# Patient Record
Sex: Female | Born: 1980 | Race: Black or African American | Hispanic: No | Marital: Single | Smoking: Never smoker
Health system: Southern US, Community
[De-identification: ages and names within clinical notes are randomized; demographics above are authoritative.]

## PROBLEM LIST (undated history)

## (undated) DIAGNOSIS — G9332 Myalgic encephalomyelitis/chronic fatigue syndrome: Secondary | ICD-10-CM

## (undated) DIAGNOSIS — D649 Anemia, unspecified: Secondary | ICD-10-CM

## (undated) DIAGNOSIS — R5382 Chronic fatigue, unspecified: Secondary | ICD-10-CM

---

## 2010-08-25 ENCOUNTER — Other Ambulatory Visit (HOSPITAL_COMMUNITY): Payer: Self-pay | Admitting: Obstetrics and Gynecology

## 2010-08-25 DIAGNOSIS — Z3682 Encounter for antenatal screening for nuchal translucency: Secondary | ICD-10-CM

## 2010-09-03 ENCOUNTER — Other Ambulatory Visit (HOSPITAL_COMMUNITY): Payer: Self-pay | Admitting: Obstetrics and Gynecology

## 2010-09-03 ENCOUNTER — Ambulatory Visit (HOSPITAL_COMMUNITY)
Admission: RE | Admit: 2010-09-03 | Discharge: 2010-09-03 | Disposition: A | Discharge status: Home / Self Care | Payer: Medicaid Other | Source: Ambulatory Visit | Attending: Obstetrics and Gynecology | Admitting: Obstetrics and Gynecology

## 2010-09-03 ENCOUNTER — Encounter (HOSPITAL_COMMUNITY): Payer: Self-pay

## 2010-09-03 ENCOUNTER — Other Ambulatory Visit: Payer: Self-pay | Admitting: Maternal and Fetal Medicine

## 2010-09-03 VITALS — BP 105/65 | HR 89 | Wt 236.0 lb

## 2010-09-03 DIAGNOSIS — O99210 Obesity complicating pregnancy, unspecified trimester: Secondary | ICD-10-CM | POA: Insufficient documentation

## 2010-09-03 DIAGNOSIS — Z3682 Encounter for antenatal screening for nuchal translucency: Secondary | ICD-10-CM

## 2010-09-03 DIAGNOSIS — O3510X0 Maternal care for (suspected) chromosomal abnormality in fetus, unspecified, not applicable or unspecified: Secondary | ICD-10-CM | POA: Insufficient documentation

## 2010-09-03 DIAGNOSIS — O34219 Maternal care for unspecified type scar from previous cesarean delivery: Secondary | ICD-10-CM | POA: Insufficient documentation

## 2010-09-03 DIAGNOSIS — O09219 Supervision of pregnancy with history of pre-term labor, unspecified trimester: Secondary | ICD-10-CM | POA: Insufficient documentation

## 2010-09-03 DIAGNOSIS — E669 Obesity, unspecified: Secondary | ICD-10-CM | POA: Insufficient documentation

## 2010-09-03 DIAGNOSIS — Z3689 Encounter for other specified antenatal screening: Secondary | ICD-10-CM | POA: Insufficient documentation

## 2010-09-03 DIAGNOSIS — O351XX0 Maternal care for (suspected) chromosomal abnormality in fetus, not applicable or unspecified: Secondary | ICD-10-CM | POA: Insufficient documentation

## 2010-09-17 NOTE — Progress Notes (Signed)
See ultrasound report in [facility] 

## 2010-10-15 ENCOUNTER — Ambulatory Visit (HOSPITAL_COMMUNITY)
Admission: RE | Admit: 2010-10-15 | Discharge: 2010-10-15 | Disposition: A | Discharge status: Home / Self Care | Payer: Medicaid Other | Source: Ambulatory Visit | Attending: Obstetrics and Gynecology | Admitting: Obstetrics and Gynecology

## 2010-10-15 DIAGNOSIS — O358XX0 Maternal care for other (suspected) fetal abnormality and damage, not applicable or unspecified: Secondary | ICD-10-CM | POA: Insufficient documentation

## 2010-10-15 DIAGNOSIS — Z1389 Encounter for screening for other disorder: Secondary | ICD-10-CM | POA: Insufficient documentation

## 2010-10-15 DIAGNOSIS — Z3682 Encounter for antenatal screening for nuchal translucency: Secondary | ICD-10-CM

## 2010-10-15 DIAGNOSIS — E669 Obesity, unspecified: Secondary | ICD-10-CM | POA: Insufficient documentation

## 2010-10-15 DIAGNOSIS — O99210 Obesity complicating pregnancy, unspecified trimester: Secondary | ICD-10-CM | POA: Insufficient documentation

## 2010-10-15 DIAGNOSIS — Z363 Encounter for antenatal screening for malformations: Secondary | ICD-10-CM | POA: Insufficient documentation

## 2010-10-15 DIAGNOSIS — O09219 Supervision of pregnancy with history of pre-term labor, unspecified trimester: Secondary | ICD-10-CM | POA: Insufficient documentation

## 2010-10-15 DIAGNOSIS — O34219 Maternal care for unspecified type scar from previous cesarean delivery: Secondary | ICD-10-CM | POA: Insufficient documentation

## 2010-10-15 NOTE — Progress Notes (Signed)
Ultrasound in [facility].  Follow up U/S scheduled 

## 2010-11-04 ENCOUNTER — Ambulatory Visit (HOSPITAL_COMMUNITY)
Admission: RE | Admit: 2010-11-04 | Discharge: 2010-11-04 | Disposition: A | Discharge status: Home / Self Care | Payer: Medicaid Other | Source: Ambulatory Visit | Attending: Obstetrics and Gynecology | Admitting: Obstetrics and Gynecology

## 2010-11-04 DIAGNOSIS — O34219 Maternal care for unspecified type scar from previous cesarean delivery: Secondary | ICD-10-CM | POA: Insufficient documentation

## 2010-11-04 DIAGNOSIS — Z3682 Encounter for antenatal screening for nuchal translucency: Secondary | ICD-10-CM

## 2010-11-04 DIAGNOSIS — O09219 Supervision of pregnancy with history of pre-term labor, unspecified trimester: Secondary | ICD-10-CM | POA: Insufficient documentation

## 2010-11-04 DIAGNOSIS — E669 Obesity, unspecified: Secondary | ICD-10-CM | POA: Insufficient documentation

## 2010-11-04 DIAGNOSIS — O99330 Smoking (tobacco) complicating pregnancy, unspecified trimester: Secondary | ICD-10-CM | POA: Insufficient documentation

## 2010-11-04 NOTE — Progress Notes (Signed)
Patient seen for ultrasound appointment today.  Please see [facility] report for details.  

## 2013-12-18 ENCOUNTER — Encounter (HOSPITAL_COMMUNITY): Payer: Self-pay

## 2015-02-13 ENCOUNTER — Emergency Department (HOSPITAL_BASED_OUTPATIENT_CLINIC_OR_DEPARTMENT_OTHER)
Admission: EM | Admit: 2015-02-13 | Discharge: 2015-02-13 | Disposition: A | Discharge status: Home / Self Care | Payer: No Typology Code available for payment source | Attending: Emergency Medicine | Admitting: Emergency Medicine

## 2015-02-13 ENCOUNTER — Emergency Department (HOSPITAL_BASED_OUTPATIENT_CLINIC_OR_DEPARTMENT_OTHER): Payer: No Typology Code available for payment source

## 2015-02-13 ENCOUNTER — Encounter (HOSPITAL_BASED_OUTPATIENT_CLINIC_OR_DEPARTMENT_OTHER): Payer: Self-pay | Admitting: *Deleted

## 2015-02-13 DIAGNOSIS — S8001XA Contusion of right knee, initial encounter: Secondary | ICD-10-CM | POA: Insufficient documentation

## 2015-02-13 DIAGNOSIS — S8991XA Unspecified injury of right lower leg, initial encounter: Secondary | ICD-10-CM | POA: Diagnosis present

## 2015-02-13 DIAGNOSIS — Y9389 Activity, other specified: Secondary | ICD-10-CM | POA: Diagnosis not present

## 2015-02-13 DIAGNOSIS — Y92410 Unspecified street and highway as the place of occurrence of the external cause: Secondary | ICD-10-CM | POA: Insufficient documentation

## 2015-02-13 DIAGNOSIS — Y998 Other external cause status: Secondary | ICD-10-CM | POA: Insufficient documentation

## 2015-02-13 MED ORDER — IBUPROFEN 200 MG PO TABS
600.0000 mg | ORAL_TABLET | Freq: Once | ORAL | Status: AC
  Administered 2015-02-13: 600 mg via ORAL
  Filled 2015-02-13: qty 1

## 2015-02-13 MED ORDER — IBUPROFEN 800 MG PO TABS: 800.0000 mg | ORAL_TABLET | Freq: Three times a day (TID) | ORAL | Status: DC | PRN

## 2015-02-13 MED ORDER — CYCLOBENZAPRINE HCL 10 MG PO TABS: 10.0000 mg | ORAL_TABLET | Freq: Three times a day (TID) | ORAL | Status: DC | PRN
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
## 2015-08-15 ENCOUNTER — Encounter (HOSPITAL_BASED_OUTPATIENT_CLINIC_OR_DEPARTMENT_OTHER): Payer: Self-pay | Admitting: *Deleted

## 2015-08-15 ENCOUNTER — Emergency Department (HOSPITAL_BASED_OUTPATIENT_CLINIC_OR_DEPARTMENT_OTHER)
Admission: EM | Admit: 2015-08-15 | Discharge: 2015-08-15 | Disposition: A | Discharge status: Home / Self Care | Payer: Self-pay | Attending: Emergency Medicine | Admitting: Emergency Medicine

## 2015-08-15 ENCOUNTER — Emergency Department (HOSPITAL_BASED_OUTPATIENT_CLINIC_OR_DEPARTMENT_OTHER): Payer: Self-pay

## 2015-08-15 DIAGNOSIS — M549 Dorsalgia, unspecified: Secondary | ICD-10-CM | POA: Insufficient documentation

## 2015-08-15 DIAGNOSIS — Y9389 Activity, other specified: Secondary | ICD-10-CM | POA: Insufficient documentation

## 2015-08-15 DIAGNOSIS — Y999 Unspecified external cause status: Secondary | ICD-10-CM | POA: Insufficient documentation

## 2015-08-15 DIAGNOSIS — W1809XA Striking against other object with subsequent fall, initial encounter: Secondary | ICD-10-CM

## 2015-08-15 DIAGNOSIS — Y929 Unspecified place or not applicable: Secondary | ICD-10-CM | POA: Insufficient documentation

## 2015-08-15 DIAGNOSIS — W01198A Fall on same level from slipping, tripping and stumbling with subsequent striking against other object, initial encounter: Secondary | ICD-10-CM | POA: Insufficient documentation

## 2015-08-15 MED ORDER — HYDROCODONE-ACETAMINOPHEN 5-325 MG PO TABS: 1.0000 | ORAL_TABLET | Freq: Four times a day (QID) | ORAL | Status: DC | PRN

## 2015-08-15 MED ORDER — HYDROCODONE-ACETAMINOPHEN 5-325 MG PO TABS
2.0000 | ORAL_TABLET | Freq: Once | ORAL | Status: AC
  Administered 2015-08-15: 2 via ORAL
  Filled 2015-08-15: qty 2

## 2015-08-15 MED FILL — HYDROCODON-APAP 5-325: 5-325 | 2 days supply | Qty: 10 | Fill #0

## 2015-08-15 NOTE — ED Notes (Signed)
She slipped last night getting out of the tub hitting her mid back. Painful today.

## 2015-08-15 NOTE — ED Notes (Signed)
Patient transported to X-ray 

## 2015-08-15 NOTE — Discharge Instructions (Signed)
Take Tylenol or Advil for mild pain or the pain medicine prescribed for bad pain. Don't take Tylenol together with the pain medicine prescribed as the combination can be dangerous. Contact your primary care physician if having significant pain in a week.
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
## 2015-08-15 NOTE — ED Provider Notes (Signed)
CSN: [id number]     Arrival date & time 08/15/15  1030 History   First MD Initiated Contact with Patient 08/15/15 1058     Chief Complaint  Patient presents with  . Back Injury     (Consider location/radiation/quality/duration/timing/severity/associated sxs/prior Treatment) HPI Patient fell last night getting out of the bathtub. She slipped and fell striking her back against the tile but. She complains of mid back pain radiating to her lower back and to bilateral thighs since the event. Treated with Tylenol, without relief. Pain worse with changing positions improved with remaining still. No other injury. No other associated symptoms History reviewed. No pertinent past medical history. History reviewed. No pertinent past surgical history. No family history on file. Social History  Substance Use Topics  . Smoking status: Never Smoker   . Smokeless tobacco: None  . Alcohol Use: No   OB History    Gravida Para Term Preterm AB TAB SAB Ectopic Multiple Living   3 2 1 1  0 0 0 0 0 2     Review of Systems  Constitutional: Negative.   HENT: Negative.   Respiratory: Negative.   Cardiovascular: Negative.   Gastrointestinal: Negative.   Genitourinary:       Currently on menses  Musculoskeletal: Positive for back pain.  Skin: Negative.   Neurological: Negative.   Psychiatric/Behavioral: Negative.   All other systems reviewed and are negative.     Allergies  Review of patient's allergies indicates no known allergies.  Home Medications   Prior to Admission medications   Medication Sig Start Date End Date Taking? Authorizing Provider  cyclobenzaprine (FLEXERIL) 10 MG tablet Take 1 tablet (10 mg total) by mouth 3 (three) times daily as needed for muscle spasms (or pain). 02/13/15   Trixie DredgeEmily West, PA-C  ibuprofen (ADVIL,MOTRIN) 800 MG tablet Take 1 tablet (800 mg total) by mouth every 8 (eight) hours as needed. 02/13/15   Trixie DredgeEmily West, PA-C  Prenatal Vitamins (DIS) TABS Take by mouth.       Historical Provider, MD   BP 114/81 mmHg  Pulse 87  Temp(Src) 97.7 F (36.5 C) (Oral)  Resp 16  Ht 5\' 8"  (1.727 m)  Wt 240 lb (108.863 kg)  BMI 36.50 kg/m2  SpO2 100%  LMP 08/09/2015 Physical Exam  Constitutional: She appears well-developed and well-nourished.  HENT:  Head: Normocephalic and atraumatic.  Eyes: Conjunctivae are normal. Pupils are equal, round, and reactive to light.  Neck: Neck supple. No tracheal deviation present. No thyromegaly present.  Cardiovascular: Normal rate and regular rhythm.   No murmur heard. Pulmonary/Chest: Effort normal and breath sounds normal.  Abdominal: Soft. Bowel sounds are normal. She exhibits no distension. There is no tenderness.  Obese  Musculoskeletal: Normal range of motion. She exhibits no edema or tenderness.  Entire spine is nontender. Bilateral parathoracic spinal tenderness.  Neurological: She is alert. She has normal reflexes. No cranial nerve deficit. Coordination normal.  DTRs symmetric bilaterally at knee jerk ankle jerk. Motor strength 5 over 5 overall  Skin: Skin is warm and dry. No rash noted.  Psychiatric: She has a normal mood and affect.  Nursing note and vitals reviewed.   ED Course  Procedures (including critical care time) Labs Review Labs Reviewed - No data to display  Imaging Review No results found. I have personally reviewed and evaluated these images and lab results as part of my medical decision-making.   EKG Interpretation None     12:45 PM pain improved after treatment with Norco.  Patient is alert and ambulates without difficulty . X-rays viewed by me and discussed with patient No results found for this or any previous visit. Dg Thoracic Spine 2 View  08/15/2015  CLINICAL DATA:  Fall, hit back on the bathtub yesterday, mid back pain, low back pain EXAM: THORACIC SPINE 2 VIEWS COMPARISON:  Report 12/27/2006 no images available. FINDINGS: Three views of thoracic spine submitted. No acute fracture  or subluxation. Alignment, disc spaces and vertebral body heights are preserved. IMPRESSION: Negative. Electronically Signed   By: Natasha MeadLiviu  Pop M.D.   On: 08/15/2015 12:00   Dg Lumbar Spine Complete  08/15/2015  CLINICAL DATA:  Fall yesterday, low back pain, radiculopathy EXAM: LUMBAR SPINE - COMPLETE 4+ VIEW COMPARISON:  Report 06/03/2003 no images available. FINDINGS: Five views of the lumbar spine submitted. No acute fracture or subluxation. Alignment and vertebral body heights are preserved. Mild disc space flattening at L5-S1 level. IMPRESSION: No acute fracture or subluxation. Mild disc space flattening at L5-S1 level. Electronically Signed   By: Natasha MeadLiviu  Pop M.D.   On: 08/15/2015 12:02    MDM  Plan prescription Norco. Follow-up with PMD if significant pain one week Diagnosis #1 fall #2 back pain Final diagnoses:  None        Doug SouSam Shanetta Nicolls, MD 08/15/15 1248

## 2016-04-14 DIAGNOSIS — N920 Excessive and frequent menstruation with regular cycle: Secondary | ICD-10-CM | POA: Insufficient documentation

## 2016-04-14 DIAGNOSIS — F4321 Adjustment disorder with depressed mood: Secondary | ICD-10-CM | POA: Insufficient documentation

## 2016-09-21 DIAGNOSIS — D500 Iron deficiency anemia secondary to blood loss (chronic): Secondary | ICD-10-CM | POA: Insufficient documentation

## 2016-09-21 DIAGNOSIS — G9332 Myalgic encephalomyelitis/chronic fatigue syndrome: Secondary | ICD-10-CM | POA: Insufficient documentation

## 2016-09-21 DIAGNOSIS — R5382 Chronic fatigue, unspecified: Secondary | ICD-10-CM | POA: Insufficient documentation

## 2016-11-12 ENCOUNTER — Emergency Department (HOSPITAL_BASED_OUTPATIENT_CLINIC_OR_DEPARTMENT_OTHER)
Admission: EM | Admit: 2016-11-12 | Discharge: 2016-11-12 | Disposition: A | Discharge status: Home / Self Care | Payer: Medicaid Other | Attending: Emergency Medicine | Admitting: Emergency Medicine

## 2016-11-12 ENCOUNTER — Emergency Department (HOSPITAL_BASED_OUTPATIENT_CLINIC_OR_DEPARTMENT_OTHER): Payer: Medicaid Other

## 2016-11-12 ENCOUNTER — Encounter (HOSPITAL_BASED_OUTPATIENT_CLINIC_OR_DEPARTMENT_OTHER): Payer: Self-pay | Admitting: Emergency Medicine

## 2016-11-12 DIAGNOSIS — Z79899 Other long term (current) drug therapy: Secondary | ICD-10-CM | POA: Diagnosis not present

## 2016-11-12 DIAGNOSIS — M542 Cervicalgia: Secondary | ICD-10-CM | POA: Insufficient documentation

## 2016-11-12 HISTORY — DX: Anemia, unspecified: D64.9

## 2016-11-12 HISTORY — DX: Myalgic encephalomyelitis/chronic fatigue syndrome: G93.32

## 2016-11-12 HISTORY — DX: Chronic fatigue, unspecified: R53.82

## 2016-11-12 MED ORDER — HYDROMORPHONE HCL 1 MG/ML IJ SOLN
0.5000 mg | Freq: Once | INTRAMUSCULAR | Status: AC
  Administered 2016-11-12: 0.5 mg via INTRAVENOUS
  Filled 2016-11-12: qty 1

## 2016-11-12 MED ORDER — KETOROLAC TROMETHAMINE 30 MG/ML IJ SOLN
30.0000 mg | Freq: Once | INTRAMUSCULAR | Status: AC
  Administered 2016-11-12: 30 mg via INTRAVENOUS
  Filled 2016-11-12: qty 1

## 2016-11-12 MED ORDER — PREDNISONE 50 MG PO TABS
60.0000 mg | ORAL_TABLET | Freq: Once | ORAL | Status: AC
  Administered 2016-11-12: 60 mg via ORAL
  Filled 2016-11-12: qty 1

## 2016-11-12 MED ORDER — OXYCODONE-ACETAMINOPHEN 5-325 MG PO TABS
1.0000 | ORAL_TABLET | Freq: Once | ORAL | Status: AC
  Administered 2016-11-12: 1 via ORAL
  Filled 2016-11-12: qty 1

## 2016-11-12 MED ORDER — PREDNISONE 20 MG PO TABS: 40.0000 mg | ORAL_TABLET | Freq: Every day | ORAL | 0 refills | Status: DC

## 2016-11-12 MED ORDER — OXYCODONE-ACETAMINOPHEN 5-325 MG PO TABS: 1.0000 | ORAL_TABLET | Freq: Four times a day (QID) | ORAL | 0 refills | Status: DC | PRN

## 2016-11-12 MED ORDER — IOPAMIDOL (ISOVUE-370) INJECTION 76%
100.0000 mL | Freq: Once | INTRAVENOUS | Status: AC | PRN
  Administered 2016-11-12: 100 mL via INTRAVENOUS

## 2016-11-12 NOTE — ED Notes (Signed)
Patient transported to CT 

## 2016-11-12 NOTE — ED Triage Notes (Signed)
PResents with left sided neck pain that began 4 days ago. Aggravating factors include any movement to the left, eating and riding in the car. RElieving factors include nothing. She has taken tylenol and Ibuprofen and a muscle relaxer without relief. The pain is described as severe. She denis numbness, tingling, headache and tinnitus.

## 2016-11-13 NOTE — ED Provider Notes (Signed)
[facility]   CSN: [id number] Arrival date & time: 11/12/16  2018     History   Chief Complaint Chief Complaint  Patient presents with  . Neck Pain    HPI Jill Mclean is a 36 y.o. female.  HPI Patient presents with left-sided neck pain. No injury. Worse with movement. Worse with swallowing. No relief with Tylenol Motrin or muscle relaxer. Severe pain. Has not had pains like this before. No trauma. No vision changes. No numbness or weakness. Past Medical History:  Diagnosis Date  . Anemia   . Chronic fatigue syndrome     There are no active problems to display for this patient.   History reviewed. No pertinent surgical history.  OB History    Gravida Para Term Preterm AB Living   0 2   SAB TAB Ectopic Multiple Live Births   0 0 0 0         Home Medications    Prior to Admission medications   Medication Sig Start Date End Date Taking? Authorizing Provider  cyclobenzaprine (FLEXERIL) 10 MG tablet Take 1 tablet (10 mg total) by mouth 3 (three) times daily as needed for muscle spasms (or pain). 02/13/15   Trixie Dredge, PA-C  HYDROcodone-acetaminophen (NORCO) 5-325 MG tablet Take 1-2 tablets by mouth every 6 (six) hours as needed for moderate pain or severe pain. 08/15/15   Doug Sou, MD  ibuprofen (ADVIL,MOTRIN) 800 MG tablet Take 1 tablet (800 mg total) by mouth every 8 (eight) hours as needed. 02/13/15   Trixie Dredge, PA-C  oxyCODONE-acetaminophen (PERCOCET/ROXICET) 5-325 MG tablet Take 1-2 tablets by mouth every 6 (six) hours as needed for severe pain. 11/12/16   Benjiman Core, MD  predniSONE (DELTASONE) 20 MG tablet Take 2 tablets (40 mg total) by mouth daily. 11/13/16   Benjiman Core, MD  Prenatal Vitamins (DIS) TABS Take by mouth.      [provider]    Family History History reviewed. No pertinent family history.  Social History Social History  Substance Use Topics  . Smoking status: Never Smoker  .  Smokeless tobacco: Not on file  . Alcohol use No     Allergies   Patient has no known allergies.   Review of Systems Review of Systems  Constitutional: Negative for appetite change and fever.  HENT: Negative for congestion.   Respiratory: Negative for shortness of breath.   Cardiovascular: Negative for chest pain.  Gastrointestinal: Negative for abdominal pain.  Musculoskeletal: Positive for neck pain.  Skin: Negative for wound.  Neurological: Negative for numbness.  Hematological: Negative for adenopathy.  Psychiatric/Behavioral: Negative for confusion.     Physical Exam Updated Vital Signs BP 118/71 (BP Location: Left Arm)   Pulse 79   Temp 98.3 F (36.8 C) (Oral)   Resp 18   LMP 10/20/2016 (Approximate)   SpO2 100%   Breastfeeding? No   Physical Exam  Constitutional: She appears well-developed.  HENT:  Head: Atraumatic.  Neck: No thyromegaly present.  Tenderness to left lateral lower neck. No erythema. Decreased range of motion. No bruit. No midline cervical tenderness  Cardiovascular: Normal rate.   Pulmonary/Chest: Effort normal.  Musculoskeletal: She exhibits no edema.  Neurological: She is alert.  Skin: Skin is warm. Capillary refill takes less than 2 seconds.  Psychiatric: She has a normal mood and affect.     ED Treatments / Results  Labs (all labs ordered are listed, but only abnormal results are displayed) Labs  Reviewed - No data to display  EKG  EKG Interpretation None       Radiology Ct Angio Neck W And/or Wo Contrast  Result Date: 11/12/2016 CLINICAL DATA:  Left-sided neck pain for 4 days EXAM: CT ANGIOGRAPHY NECK TECHNIQUE: Multidetector CT imaging of the neck was performed using the standard protocol during bolus administration of intravenous contrast. Multiplanar CT image reconstructions and MIPs were obtained to evaluate the vascular anatomy. Carotid stenosis measurements (when applicable) are obtained utilizing NASCET criteria, using  the distal internal carotid diameter as the denominator. CONTRAST:  100 mL Isovue 370 COMPARISON:  None. FINDINGS: Aortic arch: Standard branching. Imaged portion shows no evidence of aneurysm or dissection. No significant stenosis of the major arch vessel origins. Right carotid system: No evidence of dissection, stenosis (50% or greater) or occlusion. Left carotid system: No evidence of dissection, stenosis (50% or greater) or occlusion. Vertebral arteries: Codominant. No evidence of dissection, stenosis (50% or greater) or occlusion. Skeleton: Negative Other neck: Normal thyroid gland. Pharynx and larynx are normal. Salivary glands are normal. No lymphadenopathy. Upper chest: Incidentally noted azygos lobe.  No focal abnormality. IMPRESSION: Normal CTA of the neck. Electronically Signed   By: Deatra Robinson M.D.   On: 11/12/2016 22:35    Procedures Procedures (including critical care time)  Medications Ordered in ED Medications  ketorolac (TORADOL) 30 MG/ML injection 30 mg (30 mg Intravenous Given 11/12/16 2116)  HYDROmorphone (DILAUDID) injection 0.5 mg (0.5 mg Intravenous Given 11/12/16 2116)  iopamidol (ISOVUE-370) 76 % injection 100 mL (100 mLs Intravenous Contrast Given 11/12/16 2159)  oxyCODONE-acetaminophen (PERCOCET/ROXICET) 5-325 MG per tablet 1 tablet (1 tablet Oral Given 11/12/16 2321)  predniSONE (DELTASONE) tablet 60 mg (60 mg Oral Given 11/12/16 2321)     Initial Impression / Assessment and Plan / ED Course  I have reviewed the triage vital signs and the nursing notes.  Pertinent labs & imaging results that were available during my care of the patient were reviewed by me and considered in my medical decision making (see chart for details).   patient with lateral neck pain. Decreased range of motion. CT angiogram done due to severe pain with movement. Reassuring. Discharge home.  Final Clinical Impressions(s) / ED Diagnoses   Final diagnoses:  Neck pain    New  Prescriptions Discharge Medication List as of 11/12/2016 11:02 PM    START taking these medications   Details  oxyCODONE-acetaminophen (PERCOCET/ROXICET) 5-325 MG tablet Take 1-2 tablets by mouth every 6 (six) hours as needed for severe pain., Starting Thu 11/12/2016, Print    predniSONE (DELTASONE) 20 MG tablet Take 2 tablets (40 mg total) by mouth daily., Starting Fri 11/13/2016, Print         Benjiman Core, MD 11/13/16 [phone]

## 2017-03-23 ENCOUNTER — Encounter (HOSPITAL_BASED_OUTPATIENT_CLINIC_OR_DEPARTMENT_OTHER): Payer: Self-pay

## 2017-03-23 ENCOUNTER — Other Ambulatory Visit: Payer: Self-pay

## 2017-03-23 DIAGNOSIS — Y9389 Activity, other specified: Secondary | ICD-10-CM | POA: Insufficient documentation

## 2017-03-23 DIAGNOSIS — Y92410 Unspecified street and highway as the place of occurrence of the external cause: Secondary | ICD-10-CM | POA: Insufficient documentation

## 2017-03-23 DIAGNOSIS — S161XXA Strain of muscle, fascia and tendon at neck level, initial encounter: Secondary | ICD-10-CM | POA: Insufficient documentation

## 2017-03-23 DIAGNOSIS — Y999 Unspecified external cause status: Secondary | ICD-10-CM | POA: Insufficient documentation
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
## 2017-03-24 ENCOUNTER — Emergency Department (HOSPITAL_BASED_OUTPATIENT_CLINIC_OR_DEPARTMENT_OTHER): Payer: Self-pay

## 2017-03-24 ENCOUNTER — Emergency Department (HOSPITAL_BASED_OUTPATIENT_CLINIC_OR_DEPARTMENT_OTHER)
Admission: EM | Admit: 2017-03-24 | Discharge: 2017-03-24 | Disposition: A | Discharge status: Home / Self Care | Payer: Self-pay | Attending: Emergency Medicine | Admitting: Emergency Medicine

## 2017-03-24 DIAGNOSIS — S161XXA Strain of muscle, fascia and tendon at neck level, initial encounter: Secondary | ICD-10-CM

## 2017-03-24 MED ORDER — CYCLOBENZAPRINE HCL 10 MG PO TABS: 10.0000 mg | ORAL_TABLET | Freq: Three times a day (TID) | ORAL | 0 refills | Status: DC | PRN

## 2017-03-24 MED ORDER — HYDROCODONE-ACETAMINOPHEN 5-325 MG PO TABS: 1.0000 | ORAL_TABLET | ORAL | 0 refills | Status: DC | PRN

## 2017-03-24 MED ORDER — NAPROXEN 500 MG PO TABS: 500.0000 mg | ORAL_TABLET | Freq: Two times a day (BID) | ORAL | 0 refills | Status: DC | PRN

## 2017-03-24 MED ORDER — KETOROLAC TROMETHAMINE 60 MG/2ML IM SOLN
60.0000 mg | Freq: Once | INTRAMUSCULAR | Status: AC
  Administered 2017-03-24: 60 mg via INTRAMUSCULAR
  Filled 2017-03-24: qty 2
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
## 2017-08-22 ENCOUNTER — Other Ambulatory Visit: Payer: Self-pay

## 2017-08-22 ENCOUNTER — Emergency Department (HOSPITAL_BASED_OUTPATIENT_CLINIC_OR_DEPARTMENT_OTHER)
Admission: EM | Admit: 2017-08-22 | Discharge: 2017-08-22 | Disposition: A | Discharge status: Home / Self Care | Payer: Self-pay | Attending: Emergency Medicine | Admitting: Emergency Medicine

## 2017-08-22 ENCOUNTER — Encounter (HOSPITAL_BASED_OUTPATIENT_CLINIC_OR_DEPARTMENT_OTHER): Payer: Self-pay | Admitting: Emergency Medicine

## 2017-08-22 DIAGNOSIS — R51 Headache: Secondary | ICD-10-CM | POA: Insufficient documentation

## 2017-08-22 DIAGNOSIS — R2241 Localized swelling, mass and lump, right lower limb: Secondary | ICD-10-CM | POA: Insufficient documentation

## 2017-08-22 DIAGNOSIS — K089 Disorder of teeth and supporting structures, unspecified: Secondary | ICD-10-CM

## 2017-08-22 DIAGNOSIS — M7989 Other specified soft tissue disorders: Secondary | ICD-10-CM

## 2017-08-22 DIAGNOSIS — R2242 Localized swelling, mass and lump, left lower limb: Secondary | ICD-10-CM | POA: Insufficient documentation

## 2017-08-22 DIAGNOSIS — K0889 Other specified disorders of teeth and supporting structures: Secondary | ICD-10-CM | POA: Insufficient documentation

## 2017-08-22 DIAGNOSIS — R82998 Other abnormal findings in urine: Secondary | ICD-10-CM | POA: Insufficient documentation

## 2017-08-22 DIAGNOSIS — G8929 Other chronic pain: Secondary | ICD-10-CM | POA: Insufficient documentation

## 2017-08-22 LAB — BASIC METABOLIC PANEL
Anion gap: 5 (ref 5–15)
BUN: 11 mg/dL (ref 6–20)
CHLORIDE: 105 mmol/L (ref 98–111)
CO2: 27 mmol/L (ref 22–32)
Calcium: 8.6 mg/dL — ABNORMAL LOW (ref 8.9–10.3)
Creatinine, Ser: 0.76 mg/dL (ref 0.44–1.00)
GFR calc Af Amer: >60 mL/min (ref >60)
GFR calc non Af Amer: >60 mL/min (ref >60)
Glucose, Bld: 106 mg/dL — ABNORMAL HIGH (ref 70–99)
POTASSIUM: 3.4 mmol/L — AB (ref 3.5–5.1)
Sodium: 137 mmol/L (ref 135–145)

## 2017-08-22 LAB — DIFFERENTIAL
Basophils Absolute: 0 10*3/uL (ref 0.0–0.1)
Basophils Relative: 0 %
Eosinophils Absolute: 0.1 10*3/uL (ref 0.0–0.7)
Eosinophils Relative: 2 %
Lymphocytes Relative: 31 %
Lymphs Abs: 1.3 10*3/uL (ref 0.7–4.0)
Monocytes Absolute: 0.5 10*3/uL (ref 0.1–1.0)
Monocytes Relative: 11 %
Neutro Abs: 2.2 10*3/uL (ref 1.7–7.7)
Neutrophils Relative %: 56 %

## 2017-08-22 LAB — URINALYSIS, ROUTINE W REFLEX MICROSCOPIC
BILIRUBIN URINE: NEGATIVE
GLUCOSE, UA: NEGATIVE mg/dL
Hgb urine dipstick: NEGATIVE
Ketones, ur: NEGATIVE mg/dL
Nitrite: NEGATIVE
PH: 5.5 (ref 5.0–8.0)
Protein, ur: NEGATIVE mg/dL
SPECIFIC GRAVITY, URINE: 1.025 (ref 1.005–1.030)

## 2017-08-22 LAB — CBC
HEMATOCRIT: 29.5 % — AB (ref 36.0–46.0)
HEMOGLOBIN: 9.6 g/dL — AB (ref 12.0–15.0)
MCH: 23.7 pg — AB (ref 26.0–34.0)
MCHC: 32.5 g/dL (ref 30.0–36.0)
MCV: 72.8 fL — AB (ref 78.0–100.0)
Platelets: 232 10*3/uL (ref 150–400)
RBC: 4.05 MIL/uL (ref 3.87–5.11)
RDW: 15.7 % — AB (ref 11.5–15.5)
WBC: 3.8 10*3/uL — AB (ref 4.0–10.5)

## 2017-08-22 LAB — HEPATIC FUNCTION PANEL
ALT: 17 U/L (ref 0–44)
AST: 22 U/L (ref 15–41)
Albumin: 3.8 g/dL (ref 3.5–5.0)
Alkaline Phosphatase: 65 U/L (ref 38–126)
Bilirubin, Direct: <0.1 mg/dL (ref 0.0–0.2)
Total Bilirubin: 0.2 mg/dL — ABNORMAL LOW (ref 0.3–1.2)
Total Protein: 7.5 g/dL (ref 6.5–8.1)

## 2017-08-22 LAB — URINALYSIS, MICROSCOPIC (REFLEX)

## 2017-08-22 LAB — PREGNANCY, URINE: Preg Test, Ur: NEGATIVE

## 2017-08-22 MED ORDER — PENICILLIN V POTASSIUM 500 MG PO TABS: 500.0000 mg | ORAL_TABLET | Freq: Four times a day (QID) | ORAL | 0 refills | Status: AC

## 2017-08-22 MED ORDER — METOCLOPRAMIDE HCL 5 MG/ML IJ SOLN
10.0000 mg | Freq: Once | INTRAMUSCULAR | Status: AC
  Administered 2017-08-22: 10 mg via INTRAVENOUS
  Filled 2017-08-22: qty 2

## 2017-08-22 MED ORDER — NAPROXEN 500 MG PO TABS: 500.0000 mg | ORAL_TABLET | Freq: Two times a day (BID) | ORAL | 0 refills | Status: DC

## 2017-08-22 MED ORDER — DIPHENHYDRAMINE HCL 50 MG/ML IJ SOLN
25.0000 mg | Freq: Once | INTRAMUSCULAR | Status: AC
  Administered 2017-08-22: 25 mg via INTRAVENOUS
  Filled 2017-08-22: qty 1

## 2017-08-22 MED ORDER — FERROUS SULFATE 325 (65 FE) MG PO TABS: 325.0000 mg | ORAL_TABLET | Freq: Three times a day (TID) | ORAL | 0 refills | Status: DC

## 2017-08-22 MED ORDER — ACETAMINOPHEN 500 MG PO TABS
1000.0000 mg | ORAL_TABLET | Freq: Once | ORAL | Status: AC
  Administered 2017-08-22: 1000 mg via ORAL
  Filled 2017-08-22: qty 2

## 2017-08-22 MED ORDER — DOCUSATE SODIUM 250 MG PO CAPS: 250.0000 mg | ORAL_CAPSULE | Freq: Every day | ORAL | 0 refills | Status: DC

## 2017-08-22 MED ORDER — ACETAMINOPHEN 500 MG PO TABS: 1000.0000 mg | ORAL_TABLET | Freq: Four times a day (QID) | ORAL | 0 refills | Status: DC | PRN

## 2017-08-22 MED ORDER — FLUCONAZOLE 150 MG PO TABS: 150.0000 mg | ORAL_TABLET | Freq: Every day | ORAL | 0 refills | Status: AC

## 2017-08-22 NOTE — ED Triage Notes (Signed)
Patient states that she has had a toothaech x 3 months. Patient states that last night she started to develop a headache and lower extremity swelling  - patient reports that last night the pain was worse and she felt like she was going to vomit - denies any nausea at this time

## 2017-08-22 NOTE — ED Provider Notes (Signed)
[facility] EMERGENCY DEPARTMENT Provider Note   CSN: [id number] Arrival date & time: 08/22/17  1335     History   Chief Complaint Chief Complaint  Patient presents with  . Headache  . Foot Swelling    HPI Jill Mclean is a 37 y.o. female with history of anemia, chronic fatigue syndrome who presents with a 2660-month history of intermittent dental pain causing headaches.  Patient also reports a one-week history of intermittent bilateral hand and foot swelling.  She denies any pain or calf pain or swelling.  She has been taking ibuprofen without significant relief of her dental pain or headache.  She has not seen a dentist, although does have a possibility in the beginning of August in the free clinic.  She denies any chest pain, shortness of breath, abdominal pain, nausea, vomiting, urinary symptoms, fever.  HPI  Past Medical History:  Diagnosis Date  . Anemia   . Chronic fatigue syndrome     There are no active problems to display for this patient.   History reviewed. No pertinent surgical history.   OB History    Gravida  3   Para  2   Term  1   Preterm  1   AB  0   Living  2     SAB  0   TAB  0   Ectopic  0   Multiple  0   Live Births               Home Medications    Prior to Admission medications   Medication Sig Start Date End Date Taking? Authorizing Provider  acetaminophen (TYLENOL) 500 MG tablet Take 2 tablets (1,000 mg total) by mouth every 6 (six) hours as needed. 08/22/17   Murriel Eidem, Waylan BogaAlexandra M, PA-C  cyclobenzaprine (FLEXERIL) 10 MG tablet Take 1 tablet (10 mg total) by mouth 3 (three) times daily as needed for muscle spasms. 03/24/17   Gilda CreasePollina, Christopher J, MD  docusate sodium (COLACE) 250 MG capsule Take 1 capsule (250 mg total) by mouth daily. 08/22/17   Durga Saldarriaga, Waylan BogaAlexandra M, PA-C  ferrous sulfate 325 (65 FE) MG tablet Take 1 tablet (325 mg total) by mouth 3 (three) times daily with meals. 08/22/17   Marranda Arakelian, Waylan BogaAlexandra M, PA-C  fluconazole  (DIFLUCAN) 150 MG tablet Take 1 tablet (150 mg total) by mouth daily for 2 doses. If you develop symptoms, take first dose. If symptoms not resolved after 72 hours, repeat dose. 08/22/17 08/24/17  Emi HolesLaw, Bhavya Eschete M, PA-C  naproxen (NAPROSYN) 500 MG tablet Take 1 tablet (500 mg total) by mouth 2 (two) times daily. 08/22/17   Napoleon Monacelli, Waylan BogaAlexandra M, PA-C  penicillin v potassium (VEETID) 500 MG tablet Take 1 tablet (500 mg total) by mouth 4 (four) times daily for 7 days. 08/22/17 08/29/17  Emi HolesLaw, Rainbow Salman M, PA-C    Family History History reviewed. No pertinent family history.  Social History Social History   Tobacco Use  . Smoking status: Never Smoker  . Smokeless tobacco: Never Used  Substance Use Topics  . Alcohol use: No  . Drug use: No     Allergies   Patient has no known allergies.   Review of Systems Review of Systems  Constitutional: Negative for chills and fever.  HENT: Positive for dental problem. Negative for facial swelling and sore throat.   Respiratory: Negative for shortness of breath.   Cardiovascular: Negative for chest pain and leg swelling (only feet bilaterally).  Gastrointestinal: Negative for abdominal pain,  nausea and vomiting.  Genitourinary: Negative for dysuria.  Musculoskeletal: Negative for back pain.  Skin: Negative for rash and wound.  Neurological: Positive for headaches.  Psychiatric/Behavioral: The patient is not nervous/anxious.      Physical Exam Updated Vital Signs BP 112/73   Pulse 78   Temp 98.2 F (36.8 C) (Oral)   Resp 16   Ht 5\' 7"  (1.702 m)   Wt 120.2 kg (265 lb)   LMP 08/02/2017   SpO2 94%   BMI 41.50 kg/m   Physical Exam  Constitutional: She appears well-developed and well-nourished. No distress.  HENT:  Head: Normocephalic and atraumatic.  Mouth/Throat: Oropharynx is clear and moist. No oropharyngeal exudate.    No submandibular masses or tenderness  Eyes: Pupils are equal, round, and reactive to light. Conjunctivae are normal.  Right eye exhibits no discharge. Left eye exhibits no discharge. No scleral icterus.  Neck: Normal range of motion. Neck supple. No thyromegaly present.  Cardiovascular: Normal rate, regular rhythm, normal heart sounds and intact distal pulses. Exam reveals no gallop and no friction rub.  No murmur heard. Pulmonary/Chest: Effort normal and breath sounds normal. No stridor. No respiratory distress. She has no wheezes. She has no rales.  Abdominal: Soft. Bowel sounds are normal. She exhibits no distension. There is no tenderness. There is no rebound and no guarding.  Musculoskeletal: She exhibits no edema.  Lymphadenopathy:    She has no cervical adenopathy.  Neurological: She is alert. Coordination normal. GCS eye subscore is 4. GCS verbal subscore is 5. GCS motor subscore is 6.  CN 3-12 intact; normal sensation throughout; 5/5 strength in all 4 extremities; equal bilateral grip strength; no ataxia on finger-to-nose  Skin: Skin is warm and dry. No rash noted. She is not diaphoretic. No pallor.  Psychiatric: She has a normal mood and affect.  Nursing note and vitals reviewed.    ED Treatments / Results  Labs (all labs ordered are listed, but only abnormal results are displayed) Labs Reviewed  URINALYSIS, ROUTINE W REFLEX MICROSCOPIC - Abnormal; Notable for the following components:      Result Value   Leukocytes, UA TRACE (*)    All other components within normal limits  BASIC METABOLIC PANEL - Abnormal; Notable for the following components:   Potassium 3.4 (*)    Glucose, Bld 106 (*)    Calcium 8.6 (*)    All other components within normal limits  CBC - Abnormal; Notable for the following components:   WBC 3.8 (*)    Hemoglobin 9.6 (*)    HCT 29.5 (*)    MCV 72.8 (*)    MCH 23.7 (*)    RDW 15.7 (*)    All other components within normal limits  HEPATIC FUNCTION PANEL - Abnormal; Notable for the following components:   Total Bilirubin 0.2 (*)    All other components within normal  limits  URINALYSIS, MICROSCOPIC (REFLEX) - Abnormal; Notable for the following components:   Bacteria, UA MANY (*)    All other components within normal limits  URINE CULTURE  PREGNANCY, URINE  DIFFERENTIAL    EKG None  Radiology No results found.  Procedures Procedures (including critical care time)  Medications Ordered in ED Medications  metoCLOPramide (REGLAN) injection 10 mg (10 mg Intravenous Given 08/22/17 1559)  diphenhydrAMINE (BENADRYL) injection 25 mg (25 mg Intravenous Given 08/22/17 1559)  acetaminophen (TYLENOL) tablet 1,000 mg (1,000 mg Oral Given 08/22/17 1558)     Initial Impression / Assessment and Plan /  ED Course  I have reviewed the triage vital signs and the nursing notes.  Pertinent labs & imaging results that were available during my care of the patient were reviewed by me and considered in my medical decision making (see chart for details).     Patient with chronic dental pain causing headaches in a week history of intermittent bilateral foot swelling.  The swelling does not extend her calves or legs.  She denies pain in her legs.  Patient denies any chest pain or shortness of breath.  Suspect her leg swelling may be related to the increase in NSAIDs she has been taking for her dental pain causing fluid retention.  Considering return of tenderness and mild edema of the gingiva, will treat with penicillin.  Patient advised to switch to Naprosyn and Tylenol.  Patient requests Diflucan.  Labs unremarkable except for chronic anemia, hemoglobin 9.6, which patient states is stable for her.  She occasionally gets heavy periods and has been on iron in the past.  We will also refill this.  Urine is borderline, however patient is asymptomatic.  Urine culture sent.  Patient advised to see dentist soon as possible and follow-up with her doctor if her foot swelling does not improve.  Elevation also discussed.  Return precautions discussed.  Patient understands and agrees with  plan.  Patient vitals stable throughout ED course and discharged in satisfactory condition.  Final Clinical Impressions(s) / ED Diagnoses   Final diagnoses:  Chronic dental pain  Foot swelling    ED Discharge Orders        Ordered    penicillin v potassium (VEETID) 500 MG tablet  4 times daily     08/22/17 1806    naproxen (NAPROSYN) 500 MG tablet  2 times daily     08/22/17 1806    acetaminophen (TYLENOL) 500 MG tablet  Every 6 hours PRN     08/22/17 1806    fluconazole (DIFLUCAN) 150 MG tablet  Daily     08/22/17 1806    ferrous sulfate 325 (65 FE) MG tablet  3 times daily with meals     08/22/17 1821    docusate sodium (COLACE) 250 MG capsule  Daily     08/22/17 1821       Emi Holes, PA-C 08/23/17 1953    Arby Barrette, MD 08/24/17 1148

## 2017-08-22 NOTE — Discharge Instructions (Signed)
Medications: Penicillin, Naprosyn, Tylenol, Diflucan  Treatment: Take penicillin until completed.  Take Naprosyn and Tylenol ONLY as prescribed.  Do not take more than 4000 mg of Tylenol in a 24-hour period. Do not combine Naprosyn with ibuprofen, Advil, or Aleve.  Follow-up: Please see a dentist as soon as possible.  You can call the clinic if you have contacted to get on their wait list or cancellation list, or you can try to contact some of the resources below.  Please return to emergency department if you develop any new or worsening symptoms.  Please follow-up with your doctor if your foot swelling is not improving with elevation, decrease in salt intake, and decrease in NSAID use.

## 2017-08-24 LAB — URINE CULTURE: Culture: 50000 — AB

## 2017-08-25 ENCOUNTER — Telehealth: Payer: Self-pay | Admitting: Emergency Medicine

## 2017-08-25 NOTE — Telephone Encounter (Signed)
Post ED Visit - Positive Culture Follow-up  Culture report reviewed by antimicrobial stewardship pharmacist:  []  Enzo BiNathan Batchelder, Pharm.D. []  Celedonio MiyamotoJeremy Frens, Pharm.D., BCPS AQ-ID []  Garvin FilaMike Maccia, Pharm.D., BCPS []  Georgina PillionElizabeth Martin, [address]Pharm[address]D., BCPS []  RichfieldMinh Pham, [place]arm[place]D., BCPS, AAHIVP []  Estella HuskMichelle Turner, Pharm.D., BCPS, AAHIVP [x]  Lysle Pearlachel Rumbarger, PharmD, BCPS []  Phillips Climeshuy Dang, PharmD, BCPS []  Agapito GamesAlison Masters, PharmD, BCPS []  Verlan FriendsErin Deja, PharmD  Positive urine culture Treated with PCN, fluconazole, organism sensitive to the same and no further patient follow-up is required at this time.  Berle MullMiller, Hrishikesh Hoeg 08/25/2017, 10:57 AM

## 2018-01-24 ENCOUNTER — Encounter (HOSPITAL_BASED_OUTPATIENT_CLINIC_OR_DEPARTMENT_OTHER): Payer: Self-pay | Admitting: Emergency Medicine

## 2018-01-24 ENCOUNTER — Other Ambulatory Visit: Payer: Self-pay

## 2018-01-24 ENCOUNTER — Emergency Department (HOSPITAL_BASED_OUTPATIENT_CLINIC_OR_DEPARTMENT_OTHER)
Admission: EM | Admit: 2018-01-24 | Discharge: 2018-01-24 | Disposition: A | Discharge status: Home / Self Care | Payer: Medicaid Other | Attending: Emergency Medicine | Admitting: Emergency Medicine

## 2018-01-24 ENCOUNTER — Emergency Department (HOSPITAL_BASED_OUTPATIENT_CLINIC_OR_DEPARTMENT_OTHER): Payer: Medicaid Other

## 2018-01-24 DIAGNOSIS — Z79899 Other long term (current) drug therapy: Secondary | ICD-10-CM | POA: Insufficient documentation

## 2018-01-24 DIAGNOSIS — B373 Candidiasis of vulva and vagina: Secondary | ICD-10-CM | POA: Insufficient documentation

## 2018-01-24 DIAGNOSIS — B3731 Acute candidiasis of vulva and vagina: Secondary | ICD-10-CM

## 2018-01-24 LAB — URINALYSIS, ROUTINE W REFLEX MICROSCOPIC
Bilirubin Urine: NEGATIVE
Glucose, UA: NEGATIVE mg/dL
HGB URINE DIPSTICK: NEGATIVE
Ketones, ur: NEGATIVE mg/dL
Nitrite: NEGATIVE
PROTEIN: NEGATIVE mg/dL
SPECIFIC GRAVITY, URINE: 1.015 (ref 1.005–1.030)
pH: 7 (ref 5.0–8.0)

## 2018-01-24 LAB — WET PREP, GENITAL
CLUE CELLS WET PREP: NONE SEEN
Sperm: NONE SEEN
Trich, Wet Prep: NONE SEEN

## 2018-01-24 LAB — URINALYSIS, MICROSCOPIC (REFLEX): RBC / HPF: NONE SEEN RBC/hpf (ref 0–5)

## 2018-01-24 LAB — PREGNANCY, URINE: PREG TEST UR: NEGATIVE

## 2018-01-24 MED ORDER — KETOROLAC TROMETHAMINE 60 MG/2ML IM SOLN
60.0000 mg | Freq: Once | INTRAMUSCULAR | Status: AC
  Administered 2018-01-24: 60 mg via INTRAMUSCULAR
  Filled 2018-01-24: qty 2

## 2018-01-24 MED ORDER — FLUCONAZOLE 100 MG PO TABS
200.0000 mg | ORAL_TABLET | Freq: Once | ORAL | Status: AC
  Administered 2018-01-24: 200 mg via ORAL
  Filled 2018-01-24: qty 2

## 2018-01-24 MED ORDER — FLUCONAZOLE 200 MG PO TABS: 200.0000 mg | ORAL_TABLET | Freq: Every day | ORAL | 0 refills | Status: AC

## 2018-01-24 NOTE — ED Triage Notes (Signed)
Painful urination for a couple days.  Pain in right side of groin.

## 2018-01-24 NOTE — ED Notes (Signed)
Patient transported to Ultrasound 

## 2018-01-25 LAB — GC/CHLAMYDIA PROBE AMP (~~LOC~~) NOT AT ARMC
CHLAMYDIA, DNA PROBE: NEGATIVE
Neisseria Gonorrhea: NEGATIVE

## 2018-01-25 NOTE — ED Provider Notes (Signed)
[facility] EMERGENCY DEPARTMENT Provider Note   CSN: [id number] Arrival date & time: 01/24/18  1550     History   Chief Complaint Chief Complaint  Patient presents with  . Dysuria    HPI Jill Mclean is a 37 y.o. female.  The history is provided by the patient.  Dysuria   This is a new problem. The current episode started 2 days ago. The problem occurs every urination. The problem has been gradually worsening. The quality of the pain is described as burning. The pain is at a severity of 4/10. The pain is moderate. There has been no fever. She is sexually active (with women only). Associated symptoms include discharge, frequency, hesitancy and urgency. Pertinent negatives include no chills, no sweats, no nausea, no vomiting and no possible pregnancy. Associated symptoms comments: Mild vaginal discharge. She has tried nothing for the symptoms. Her past medical history does not include recurrent UTIs.    Past Medical History:  Diagnosis Date  . Anemia   . Chronic fatigue syndrome     There are no active problems to display for this patient.   History reviewed. No pertinent surgical history.   OB History    Gravida  3   Para  2   Term  1   Preterm  1   AB  0   Living  2     SAB  0   TAB  0   Ectopic  0   Multiple  0   Live Births               Home Medications    Prior to Admission medications   Medication Sig Start Date End Date Taking? Authorizing Provider  acetaminophen (TYLENOL) 500 MG tablet Take 2 tablets (1,000 mg total) by mouth every 6 (six) hours as needed. 08/22/17   Law, Waylan Boga, PA-C  fluconazole (DIFLUCAN) 200 MG tablet Take 1 tablet (200 mg total) by mouth daily for 3 days. 01/24/18 01/27/18  Gwyneth Sprout, MD    Family History No family history on file.  Social History Social History   Tobacco Use  . Smoking status: Never Smoker  . Smokeless tobacco: Never Used  Substance Use Topics  . Alcohol use: No  .  Drug use: No     Allergies   Patient has no known allergies.   Review of Systems Review of Systems  Constitutional: Negative for chills.  Gastrointestinal: Negative for nausea and vomiting.  Genitourinary: Positive for dysuria, frequency, hesitancy and urgency.  All other systems reviewed and are negative.    Physical Exam Updated Vital Signs BP 128/84 (BP Location: Right Arm)   Pulse 88   Temp 98.1 F (36.7 C) (Oral)   Resp 18   Ht 5\' 6"  (1.676 m)   Wt 121.6 kg   LMP 12/29/2017   SpO2 100%   BMI 43.26 kg/m   Physical Exam  Constitutional: She is oriented to person, place, and time. She appears well-developed and well-nourished. No distress.  HENT:  Head: Normocephalic and atraumatic.  Mouth/Throat: Oropharynx is clear and moist.  Eyes: Pupils are equal, round, and reactive to light. Conjunctivae and EOM are normal.  Neck: Normal range of motion. Neck supple.  Cardiovascular: Normal rate, regular rhythm and intact distal pulses.  No murmur heard. Pulmonary/Chest: Effort normal and breath sounds normal. No respiratory distress. She has no wheezes. She has no rales.  Abdominal: Soft. She exhibits no distension. There is tenderness in the suprapubic area.  There is no rebound and no guarding. No hernia.    Genitourinary: Uterus normal. There is no rash or tenderness on the right labia. There is no rash or tenderness on the left labia. Cervix exhibits no motion tenderness and no discharge. Left adnexum displays no mass, no tenderness and no fullness. No erythema in the vagina. No signs of injury around the vagina. Vaginal discharge found.  Genitourinary Comments: Minimal tenderness in right adnexa  Musculoskeletal: Normal range of motion. She exhibits no edema or tenderness.  Neurological: She is alert and oriented to person, place, and time.  Skin: Skin is warm and dry. No rash noted. No erythema.  Psychiatric: She has a normal mood and affect. Her behavior is normal.    Nursing note and vitals reviewed.    ED Treatments / Results  Labs (all labs ordered are listed, but only abnormal results are displayed) Labs Reviewed  WET PREP, GENITAL - Abnormal; Notable for the following components:      Result Value   Yeast Wet Prep HPF POC PRESENT (*)    WBC, Wet Prep HPF POC MANY (*)    All other components within normal limits  URINALYSIS, ROUTINE W REFLEX MICROSCOPIC - Abnormal; Notable for the following components:   Leukocytes, UA TRACE (*)    All other components within normal limits  URINALYSIS, MICROSCOPIC (REFLEX) - Abnormal; Notable for the following components:   Bacteria, UA RARE (*)    All other components within normal limits  PREGNANCY, URINE  GC[facility]CHLAMYDIA PROBE AMP ([place]    EKG None  Radiology [place] Transvaginal Non-ob  Result Date: 01/24/2018 CLINICAL DATA:  Initial evaluation for acute right-sided pelvic pain for 2 days. EXAM: TRANSABDOMINAL AND TRANSVAGINAL ULTRASOUND OF PELVIS TECHNIQUE: Both transabdominal and transvaginal ultrasound examinations of the pelvis were performed. Transabdominal technique was performed for global imaging of the pelvis including uterus, ovaries, adnexal regions, and pelvic cul-de-sac. It was necessary to proceed with endovaginal exam following the transabdominal exam to visualize the uterus, endometrium, and ovaries. COMPARISON:  None available. FINDINGS: Examination technically limited by body habitus. Uterus Measurements: 9.5 x 4.9 x 7.7 cm = volume: 189 mL. 1.5 x 1.3 x 1.3 cm intramural fibroid present at the left anterior uterine body. Endometrium Thickness: 10 mm.  No focal abnormality visualized. Right ovary Measurements: 3.2 x 1.8 x 3.5 cm = volume: 10 mL. Normal appearance/no adnexal mass. Left ovary Measurements: 3.5 x 2.0 x 2.4 cm = volume: 8.7 mL. Normal appearance/no adnexal mass. Pulsed Doppler evaluation of both ovaries could not be performed due to poor visualization of the ovaries  bilaterally. No ovarian large min, abnormal echotexture, or other secondary findings to suggest ovarian torsion identified. Other findings No abnormal free fluid. IMPRESSION: 1. Somewhat technically limited examination due to body habitus. 2. No definite acute abnormality identified within the pelvis. Please note that doppler interrogation of the ovaries was unable to be performed due to poor visualization of the ovaries bilaterally. However, the ovaries themselves are relatively normal in appearance on grayscale imaging, with no secondary signs to suggest torsion or other acute abnormality. 3. 1.5 cm intramural fibroid. Electronically Signed   By: Rise MuBenjamin  McClintock M.D.   On: 01/24/2018 22:16   [place] Pelvis Complete  Result Date: 01/24/2018 CLINICAL DATA:  Initial evaluation for acute right-sided pelvic pain for 2 days. EXAM: TRANSABDOMINAL AND TRANSVAGINAL ULTRASOUND OF PELVIS TECHNIQUE: Both transabdominal and transvaginal ultrasound examinations of the pelvis were performed. Transabdominal technique was performed for global  imaging of the pelvis including uterus, ovaries, adnexal regions, and pelvic cul-de-sac. It was necessary to proceed with endovaginal exam following the transabdominal exam to visualize the uterus, endometrium, and ovaries. COMPARISON:  None available. FINDINGS: Examination technically limited by body habitus. Uterus Measurements: 9.5 x 4.9 x 7.7 cm = volume: 189 mL. 1.5 x 1.3 x 1.3 cm intramural fibroid present at the left anterior uterine body. Endometrium Thickness: 10 mm.  No focal abnormality visualized. Right ovary Measurements: 3.2 x 1.8 x 3.5 cm = volume: 10 mL. Normal appearance/no adnexal mass. Left ovary Measurements: 3.5 x 2.0 x 2.4 cm = volume: 8.7 mL. Normal appearance/no adnexal mass. Pulsed Doppler evaluation of both ovaries could not be performed due to poor visualization of the ovaries bilaterally. No ovarian large min, abnormal echotexture, or other secondary findings  to suggest ovarian torsion identified. Other findings No abnormal free fluid. IMPRESSION: 1. Somewhat technically limited examination due to body habitus. 2. No definite acute abnormality identified within the pelvis. Please note that doppler interrogation of the ovaries was unable to be performed due to poor visualization of the ovaries bilaterally. However, the ovaries themselves are relatively normal in appearance on grayscale imaging, with no secondary signs to suggest torsion or other acute abnormality. 3. 1.5 cm intramural fibroid. Electronically Signed   By: Rise Mu M.D.   On: 01/24/2018 22:16    Procedures Procedures (including critical care time)  Medications Ordered in ED Medications  ketorolac (TORADOL) injection 60 mg (60 mg Intramuscular Given 01/24/18 2040)  fluconazole (DIFLUCAN) tablet 200 mg (200 mg Oral Given 01/24/18 2300)     Initial Impression / Assessment and Plan / ED Course  I have reviewed the triage vital signs and the nursing notes.  Pertinent labs & imaging results that were available during my care of the patient were reviewed by me and considered in my medical decision making (see chart for details).     Patient presenting with 2 days of burning with urination and right pelvic discomfort.  Patient has no symptoms concerning for pyelonephritis such as fever or vomiting.  She is sexually active with only women and low suspicion for STI.  On pelvic exam patient has mild tenderness in the right adnexa but no cervical motion tenderness or concern for PID.  Wet prep is positive for yeast which is most likely the cause of her dysuria as patient's urine is within normal limits.  Ultrasound negative for acute pathology.  Ovaries are not able to be identified to do blood flow however symptoms are not suggestive of torsion.  No evidence of ovarian cysts are identified and low suspicion that patient would have a TOA.  Patient treated for candidal vaginitis and given  follow-up if does not improve.  Final Clinical Impressions(s) / ED Diagnoses   Final diagnoses:  Candidal vaginitis    ED Discharge Orders         Ordered    fluconazole (DIFLUCAN) 200 MG tablet  Daily     01/24/18 2258           Gwyneth Sprout, MD 01/25/18 [phone]

## 2018-09-15 DIAGNOSIS — L910 Hypertrophic scar: Secondary | ICD-10-CM | POA: Insufficient documentation

## 2019-04-28 ENCOUNTER — Ambulatory Visit: Payer: Medicaid Other

## 2019-04-29 ENCOUNTER — Ambulatory Visit: Payer: Medicaid Other | Attending: Internal Medicine

## 2019-04-29 ENCOUNTER — Ambulatory Visit: Payer: Medicaid Other

## 2019-04-29 DIAGNOSIS — Z23 Encounter for immunization: Secondary | ICD-10-CM

## 2019-04-29 NOTE — Progress Notes (Signed)
   Covid-19 Vaccination Clinic  Name:  Naylin Burkle    MRN: [id number] DOB: [birth year]  04/29/2019  Ms. Farooq was observed post Covid-19 immunization for 15 minutes without incident. She was provided with Vaccine Information Sheet and instruction to access the V-Safe system.   Ms. Slappey was instructed to call 911 with any severe reactions post vaccine: Marland Kitchen Difficulty breathing  . Swelling of face and throat  . A fast heartbeat  . A bad rash all over body  . Dizziness and weakness   Immunizations Administered    Name Date Dose VIS Date Route   Pfizer COVID-19 Vaccine 04/29/2019  8:28 AM 0.3 mL 01/27/2019 Intramuscular   Manufacturer: [employer], [employer]   Lot: [id number]   NDC: [address]-5

## 2019-05-22 ENCOUNTER — Ambulatory Visit: Payer: Medicaid Other | Attending: Internal Medicine

## 2019-05-22 ENCOUNTER — Ambulatory Visit: Payer: Medicaid Other

## 2019-05-22 DIAGNOSIS — Z23 Encounter for immunization: Secondary | ICD-10-CM

## 2019-05-22 NOTE — Progress Notes (Signed)
   Covid-19 Vaccination Clinic  Name:  Jill Mclean    MRN: [id number] DOB: 25-Nov-1980  05/22/2019  [name] was observed post Covid-19 immunization for 15 minutes without incident. She was provided with Vaccine Information Sheet and instruction to access the V-Safe system.   [name] was instructed to call 911 with any severe reactions post vaccine: Marland Kitchen Difficulty breathing  . Swelling of face and throat  . A fast heartbeat  . A bad rash all over body  . Dizziness and weakness   Immunizations Administered    Name Date Dose VIS Date Route   Pfizer COVID-19 Vaccine 05/22/2019  9:54 AM 0.3 mL 01/27/2019 Intramuscular   Manufacturer: [employer], [employer]   Lot: [id number]   NDC: [address]-9

## 2019-11-05 ENCOUNTER — Other Ambulatory Visit: Payer: Self-pay

## 2019-11-05 ENCOUNTER — Encounter (HOSPITAL_BASED_OUTPATIENT_CLINIC_OR_DEPARTMENT_OTHER): Payer: Self-pay | Admitting: Emergency Medicine

## 2019-11-05 DIAGNOSIS — Y9289 Other specified places as the place of occurrence of the external cause: Secondary | ICD-10-CM | POA: Insufficient documentation

## 2019-11-05 DIAGNOSIS — X500XXA Overexertion from strenuous movement or load, initial encounter: Secondary | ICD-10-CM | POA: Diagnosis not present

## 2019-11-05 DIAGNOSIS — S39012A Strain of muscle, fascia and tendon of lower back, initial encounter: Secondary | ICD-10-CM | POA: Diagnosis not present

## 2019-11-05 DIAGNOSIS — S3992XA Unspecified injury of lower back, initial encounter: Secondary | ICD-10-CM | POA: Diagnosis present

## 2019-11-05 DIAGNOSIS — Y93F2 Activity, caregiving, lifting: Secondary | ICD-10-CM | POA: Insufficient documentation

## 2019-11-05 NOTE — ED Triage Notes (Signed)
Pt states she was at work and lifted something heavy. Now has back pain to lumbar area. Pt is ambulatory. Employer knows she is at ED.

## 2019-11-06 ENCOUNTER — Emergency Department (HOSPITAL_BASED_OUTPATIENT_CLINIC_OR_DEPARTMENT_OTHER)
Admission: EM | Admit: 2019-11-06 | Discharge: 2019-11-06 | Disposition: A | Discharge status: Home / Self Care | Payer: No Typology Code available for payment source | Attending: Emergency Medicine | Admitting: Emergency Medicine

## 2019-11-06 DIAGNOSIS — S39012A Strain of muscle, fascia and tendon of lower back, initial encounter: Secondary | ICD-10-CM

## 2019-11-06 MED ORDER — KETOROLAC TROMETHAMINE 30 MG/ML IJ SOLN
30.0000 mg | Freq: Once | INTRAMUSCULAR | Status: AC
  Administered 2019-11-06: 30 mg via INTRAMUSCULAR
  Filled 2019-11-06: qty 1

## 2019-11-06 MED ORDER — NAPROXEN 500 MG PO TABS: 500.0000 mg | ORAL_TABLET | Freq: Two times a day (BID) | ORAL | 0 refills | Status: DC

## 2019-11-06 MED ORDER — CYCLOBENZAPRINE HCL 5 MG PO TABS: 5.0000 mg | ORAL_TABLET | Freq: Two times a day (BID) | ORAL | 0 refills | Status: DC | PRN

## 2019-11-06 NOTE — ED Provider Notes (Signed)
[facility] EMERGENCY DEPARTMENT Provider Note   CSN: [id number] Arrival date & time: 11/05/19  2300     History Chief Complaint  Patient presents with  . Back Pain    Jill Mclean is a 39 y.o. female.  HPI     This 39 year old female with a history of anemia who presents with back pain.  Patient reports that she was at her job and lifted a heavy box.  At that time she felt a pop and immediate pain in her right lower back.  Denies radiation of pain but feels like there was a knife shooting in her back.  She currently rates her pain at 9 out of 10.  She is not taking anything for the pain.  She denies any bowel or bladder difficulty.  Denies weakness, numbness, tingling of the lower extremities.  Past Medical History:  Diagnosis Date  . Anemia   . Chronic fatigue syndrome     There are no problems to display for this patient.   History reviewed. No pertinent surgical history.   OB History    Gravida  3   Para  2   Term  1   Preterm  1   AB  0   Living  2     SAB  0   TAB  0   Ectopic  0   Multiple  0   Live Births              No family history on file.  Social History   Tobacco Use  . Smoking status: Never Smoker  . Smokeless tobacco: Never Used  Substance Use Topics  . Alcohol use: No  . Drug use: No    Home Medications Prior to Admission medications   Medication Sig Start Date End Date Taking? Authorizing Provider  acetaminophen (TYLENOL) 500 MG tablet Take 2 tablets (1,000 mg total) by mouth every 6 (six) hours as needed. 08/22/17   Law, Waylan Boga, PA-C  cyclobenzaprine (FLEXERIL) 5 MG tablet Take 1 tablet (5 mg total) by mouth 2 (two) times daily as needed for muscle spasms. 11/06/19   Joany Khatib, Mayer Masker, MD  naproxen (NAPROSYN) 500 MG tablet Take 1 tablet (500 mg total) by mouth 2 (two) times daily. 11/06/19   Wannetta Langland, Mayer Masker, MD    Allergies    Patient has no known allergies.  Review of Systems   Review of Systems   Constitutional: Negative for fever.  Musculoskeletal: Positive for back pain.  Neurological: Negative for weakness and numbness.    Physical Exam Updated Vital Signs BP 108/69 (BP Location: Right Arm)   Pulse 72   Temp 98.3 F (36.8 C) (Oral)   Resp 20   Ht 1.676 m (5\' 6" )   Wt 109.8 kg   LMP 10/29/2019   SpO2 100%   BMI 39.06 kg/m   Physical Exam Vitals and nursing note reviewed.  Constitutional:      Appearance: She is well-developed. She is obese. She is not ill-appearing.  HENT:     Head: Normocephalic and atraumatic.     Mouth/Throat:     Mouth: Mucous membranes are moist.  Eyes:     Pupils: Pupils are equal, round, and reactive to light.  Cardiovascular:     Rate and Rhythm: Normal rate and regular rhythm.  Pulmonary:     Effort: Pulmonary effort is normal. No respiratory distress.  Musculoskeletal:     Cervical back: Neck supple.     Comments:  Tenderness to palpation right paraspinous muscle region of the lower lumbar spine, no step-off or deformity noted  Skin:    General: Skin is warm and dry.  Neurological:     Mental Status: She is alert and oriented to person, place, and time.     Comments: 5 out of 5 strength bilateral lower extremities, normal reflexes, no clonus, negative straight leg raise  Psychiatric:        Mood and Affect: Mood normal.     ED Results / Procedures / Treatments   Labs (all labs ordered are listed, but only abnormal results are displayed) Labs Reviewed - No data to display  EKG None  Radiology No results found.  Procedures Procedures (including critical care time)  Medications Ordered in ED Medications  ketorolac (TORADOL) 30 MG/ML injection 30 mg (has no administration in time range)    ED Course  I have reviewed the triage vital signs and the nursing notes.  Pertinent labs & imaging results that were available during my care of the patient were reviewed by me and considered in my medical decision making (see  chart for details).    MDM Rules/Calculators/A&P                          Patient presents with lower back pain after lifting heavy box at work.  She is overall nontoxic and vital signs are reassuring.  She is neurovascularly intact.  She has no signs or symptoms of cauda equina.  Symptoms are not consistent with sciatica.  Suspect lumbosacral strain.  Doubt fracture.  Do not feel she has indication for x-rays at this time.  Recommend anti-inflammatories and a short course of muscle relaxants.  She was given a work note and lifting restrictions.  After history, exam, and medical workup I feel the patient has been appropriately medically screened and is safe for discharge home. Pertinent diagnoses were discussed with the patient. Patient was given return precautions.    Final Clinical Impression(s) / ED Diagnoses Final diagnoses:  Strain of lumbar region, initial encounter    Rx / DC Orders ED Discharge Orders         Ordered    naproxen (NAPROSYN) 500 MG tablet  2 times daily        11/06/19 0236    cyclobenzaprine (FLEXERIL) 5 MG tablet  2 times daily PRN        11/06/19 0236           Shon Baton, MD 11/06/19 [phone]

## 2019-11-06 NOTE — Discharge Instructions (Signed)
You were seen today for lower back pain after lifting.  You likely have a lumbar strain.  Take naproxen as needed for pain.  You may take Flexeril but you may not drive while taking Flexeril.  Do not do any heavy lifting until pain has resolved.

## 2020-10-09 NOTE — Progress Notes (Signed)
Subjective:    CC: B wrist/hand and L knee pain  I, Jill Mclean, LAT, ATC, am serving as scribe for Dr. Clementeen Graham.  HPI: Pt is a 40 y/o RHD female presenting w/ c/o B wrist/hand and L knee pain.  Wrist/hand pain, R>L: Pain x 2 months w/ no known MOI.  She works w/ [employer], doing a lot of repetitive activity -Swelling: yes in her hands -Numbness/tingling: yes at night and while working, especially in fingers 2-5 -Aggravating factors: repetitive activity at work; at night; holding her phone for an extended period of time; holding steering wheel for prolonged period of time -Treatments tried: Meloxicam; Tylenol; Advil; wrist braces  L knee pain for a while w/ no known MOI -Swelling: intermittently -Mechanical symptoms: some instability; intermittent mechanical symptoms -Aggravating factors: cold; prolonged standing; prolonged walking; climbing stairs; transitioning from sitting to standing -Treatments tried:same medications as above for her B wrist and hand pain  Pertinent review of Systems: No fevers or chills  Relevant historical information: History of anemia   Objective:    Vitals:   10/10/20 0825  BP: 110/80  Pulse: 81  SpO2: 98%   General: Well Developed, well nourished, and in no acute distress.   MSK: Bilateral wrist normal-appearing Positive Tinel's carpal tunnel positive Phalen's test. Intact strength.  Left knee normal-appearing Normal motion with crepitation. Mildly tender palpation medial joint line. Stable ligamentous exam. Negative Murray's test.  Lab and Radiology Results  Procedure: Real-time Ultrasound Guided hydrodissection and injection of median nerve right carpal tunnel Device: Philips Affiniti 50G Images permanently stored and available for review in PACS Verbal informed consent obtained.  Discussed risks and benefits of procedure. Warned about infection bleeding damage to structures skin hypopigmentation and fat atrophy among  others. Patient expresses understanding and agreement Time-out conducted.   Noted no overlying erythema, induration, or other signs of local infection.   Skin prepped in a sterile fashion.   Local anesthesia: Topical Ethyl chloride.   With sterile technique and under real time ultrasound guidance: 40 mg of Kenalog and 1 mL of lidocaine injected into right carpal tunnel around median nerve. Fluid seen entering the carpal tunnel.   Completed without difficulty   Pain immediately resolved suggesting accurate placement of the medication.   Advised to call if fevers/chills, erythema, induration, drainage, or persistent bleeding.   Images permanently stored and available for review in the ultrasound unit.  Impression: Technically successful ultrasound guided injection.    Procedure: Real-time Ultrasound Guided hydrodissection and injection around median nerve at left carpal tunnel Device: Philips Affiniti 50G Images permanently stored and available for review in PACS Verbal informed consent obtained.  Discussed risks and benefits of procedure. Warned about infection bleeding damage to structures skin hypopigmentation and fat atrophy among others. Patient expresses understanding and agreement Time-out conducted.   Noted no overlying erythema, induration, or other signs of local infection.   Skin prepped in a sterile fashion.   Local anesthesia: Topical Ethyl chloride.   With sterile technique and under real time ultrasound guidance: 40 mg of Kenalog and 1 mL of lidocaine injected into left carpal tunnel around median nerve. Fluid seen entering the carpal tunnel.   Completed without difficulty   Pain immediately resolved suggesting accurate placement of the medication.   Advised to call if fevers/chills, erythema, induration, drainage, or persistent bleeding.   Images permanently stored and available for review in the ultrasound unit.  Impression: Technically successful ultrasound guided  injection.    X-ray images  left knee obtained today personally and independently interpreted Mild medial compartment DJD.  Osteophyte superior patellar pole. 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  Patient is failing early conservative management strategies including splinting.  Symptoms are moderate to severe.  Plan for bilateral injection and median nerve hydrodissection today.  As her symptoms are more severe we will go ahead and proceed with nerve conduction study and referral to neurology for nerve conduction study.  Recheck after nerve conduction study.  Additionally she has left knee pain thought to be due to some mild DJD and patellofemoral pain syndrome.  Plan for strengthening and Voltaren gel.  Reassess in about a month after nerve conduction study.  Anticipate possible intra-articular steroid injection and possible physical therapy referral then.Marland Kitchen  PDMP not reviewed this encounter. Orders Placed This Encounter  Procedures   [place] LIMITED JOINT SPACE STRUCTURES UP BILAT(NO LINKED CHARGES)    Order Specific Question:   Reason for Exam (SYMPTOM  OR DIAGNOSIS REQUIRED)    Answer:   B hand pain    Order Specific Question:   Preferred imaging location?    Answer:   [facility] Knee AP/LAT W/Sun[place] Left    Standing Status:   Future    Number of Occurrences:   1    Standing Expiration Date:   10/10/2021    Order Specific Question:   Reason for Exam (SYMPTOM  OR DIAGNOSIS REQUIRED)    Answer:   left knee pain    Order Specific Question:   Is patient pregnant?    Answer:   No    Order Specific Question:   Preferred imaging location?    Answer:   [name]   Ambulatory referral to Neurology    Referral Priority:   Routine    Referral Type:   Consultation    Referral Reason:   Specialty Services Required    Requested Specialty:   Neurology     Number of Visits Requested:   1   NCV with EMG(electromyography)    Standing Status:   Future    Standing Expiration Date:   10/10/2021    Order Specific Question:   Where should this test be performed?    Answer:   [facility]   No orders of the defined types were placed in this encounter.   Discussed warning signs or symptoms. Please see discharge instructions. Patient expresses understanding.   The above documentation has been reviewed and is accurate and complete Clementeen Graham, M.D.

## 2020-10-10 ENCOUNTER — Encounter: Payer: Self-pay | Admitting: Family Medicine

## 2020-10-10 ENCOUNTER — Ambulatory Visit (INDEPENDENT_AMBULATORY_CARE_PROVIDER_SITE_OTHER): Payer: BLUE CROSS/BLUE SHIELD

## 2020-10-10 ENCOUNTER — Other Ambulatory Visit: Payer: Self-pay

## 2020-10-10 ENCOUNTER — Ambulatory Visit (INDEPENDENT_AMBULATORY_CARE_PROVIDER_SITE_OTHER): Payer: BLUE CROSS/BLUE SHIELD | Admitting: Family Medicine

## 2020-10-10 ENCOUNTER — Ambulatory Visit: Payer: Self-pay

## 2020-10-10 VITALS — BP 110/80 | HR 81 | Ht 66.0 in | Wt 275.6 lb

## 2020-10-10 DIAGNOSIS — M79642 Pain in left hand: Secondary | ICD-10-CM

## 2020-10-10 DIAGNOSIS — M25562 Pain in left knee: Secondary | ICD-10-CM | POA: Diagnosis not present

## 2020-10-10 DIAGNOSIS — M79641 Pain in right hand: Secondary | ICD-10-CM | POA: Diagnosis not present

## 2020-10-10 DIAGNOSIS — G5603 Carpal tunnel syndrome, bilateral upper limbs: Secondary | ICD-10-CM | POA: Diagnosis not present

## 2020-10-10 NOTE — Patient Instructions (Addendum)
Nice to meet you today.  You had B carpal tunnel injections.  Call or go to the ER if you develop a large red swollen joint with extreme pain or oozing puss.   Plan for nerve study.   Please get an Xray today before you leave   Use those braces.   Recheck after the nerve study.   Please use Voltaren gel (Generic Diclofenac Gel) up to 4x daily for pain as needed.  This is available over-the-counter as both the name brand Voltaren gel and the generic diclofenac gel.   I can do a knee injection as nearly as 1 week from now if I need to but better when you come back.

## 2020-10-11 NOTE — Progress Notes (Signed)
Left knee xray does not show significant arthritis or fracture.

## 2020-11-20 NOTE — Progress Notes (Deleted)
   I, Christoper Fabian, LAT, ATC, am serving as scribe for Dr. Clementeen Graham.  Jill Mclean is a 40 y.o. female who presents to [facility] at [facility] today for L shoulder pain? And f/u of B wrist/hand pain and paresthesias due to CTS and L knee pain.  She was last seen by Dr. Denyse Amass on 10/10/20 and had B carpal tunnel injections.  She was also referred to neurology for B UE NCV/EMG tests but has not completed those yet.  She was advised to wear a knee brace.  Today, pt reports   Diagnostic testing: L knee XR- 10/10/20  Pertinent review of systems: ***  Relevant historical information: ***   Exam:  There were no vitals taken for this visit. General: Well Developed, well nourished, and in no acute distress.   MSK: ***    Lab and Radiology Results No results found for this or any previous visit (from the past 72 hour(s)). No results found.     Assessment and Plan: 40 y.o. female with ***   PDMP not reviewed this encounter. No orders of the defined types were placed in this encounter.  No orders of the defined types were placed in this encounter.    Discussed warning signs or symptoms. Please see discharge instructions. Patient expresses understanding.   ***

## 2020-11-21 ENCOUNTER — Ambulatory Visit: Payer: BLUE CROSS/BLUE SHIELD | Admitting: Family Medicine

## 2021-06-11 ENCOUNTER — Ambulatory Visit: Payer: BLUE CROSS/BLUE SHIELD | Admitting: Family Medicine

## 2021-06-11 ENCOUNTER — Ambulatory Visit (INDEPENDENT_AMBULATORY_CARE_PROVIDER_SITE_OTHER): Payer: BLUE CROSS/BLUE SHIELD | Admitting: Family Medicine

## 2021-06-11 ENCOUNTER — Ambulatory Visit: Payer: Self-pay

## 2021-06-11 VITALS — BP 144/78 | HR 90 | Ht 66.0 in | Wt 231.4 lb

## 2021-06-11 DIAGNOSIS — G5603 Carpal tunnel syndrome, bilateral upper limbs: Secondary | ICD-10-CM

## 2021-06-11 NOTE — Progress Notes (Signed)
? ?I, Philbert Riser, LAT, ATC acting as a scribe for Clementeen Graham, MD. ? ?Jill Mclean is a 41 y.o. female who presents to [facility] at [facility] today for continued bilat wrist pain due to carpal tunnel. Pt was last seen by Dr. Denyse Amass on 10/10/20 and was given bilat carpal tunnel steroid injections and pt was referred to for a NCV study, but never obtained. Pt was a no-show for her f/u visit on 11/21/20. Today, pt reports she did not get NCV study due to feeling better from steroid injection. Pt works for [employer] and lifting heavy objects, at a fast pace, constantly and is feeling like she needs a doctor's not to ask for work accommodations. Pt reports increased pain when she switched jobs, 6-7 months ago. ? ? ?Pertinent review of systems: No fevers or chills ? ?Relevant historical information: Iron deficiency ? ? ?Exam:  ?BP (!) 144/78   Pulse 90   Ht 5\' 6"  (1.676 m)   Wt 231 lb 6.4 oz (105 kg)   SpO2 100%   BMI 37.35 kg/m?  ?General: Well Developed, well nourished, and in no acute distress.  ? ?MSK: Wrist bilaterally normal. ?Positive Tinel's test.  Grip strength intact. ? ? ? ?Lab and Radiology Results ? ?Procedure: Real-time Ultrasound Guided hydrodissection median nerve right carpal tunnel ?Device: Philips Affiniti 50G ?Images permanently stored and available for review in PACS ?Verbal informed consent obtained.  Discussed risks and benefits of procedure. Warned about infection, bleeding, hyperglycemia damage to structures among others. ?Patient expresses understanding and agreement ?Time-out conducted.   ?Noted no overlying erythema, induration, or other signs of local infection.   ?Skin prepped in a sterile fashion.   ?Local anesthesia: Topical Ethyl chloride.   ?With sterile technique and under real time ultrasound guidance: 40 mg of Kenalog and 2 mL of lidocaine injected into the carpal tunnel around median nerve. Fluid seen entering the tunnel.   ?Completed without difficulty    ?Pain immediately resolved suggesting accurate placement of the medication.   ?Advised to call if fevers/chills, erythema, induration, drainage, or persistent bleeding.   ?Images permanently stored and available for review in the ultrasound unit.  ?Impression: Technically successful ultrasound guided injection. ? ? ? ?Procedure: Real-time Ultrasound Guided hydrodissection median nerve left carpal tunnel ?Device: Philips Affiniti 50G ?Images permanently stored and available for review in PACS ?Verbal informed consent obtained.  Discussed risks and benefits of procedure. Warned about infection, bleeding, hyperglycemia damage to structures among others. ?Patient expresses understanding and agreement ?Time-out conducted.   ?Noted no overlying erythema, induration, or other signs of local infection.   ?Skin prepped in a sterile fashion.   ?Local anesthesia: Topical Ethyl chloride.   ?With sterile technique and under real time ultrasound guidance: 40 mg of Kenalog and 2 mL of lidocaine injected into carpal tunnel around median nerve nerve. Fluid seen entering the carpal tunnel.   ?Completed without difficulty   ?Pain immediately resolved suggesting accurate placement of the medication.   ?Advised to call if fevers/chills, erythema, induration, drainage, or persistent bleeding.   ?Images permanently stored and available for review in the ultrasound unit.  ?Impression: Technically successful ultrasound guided injection. ? ? ? ? ? ? ? ? ?Assessment and Plan: ?41 y.o. female with bilateral carpal tunnel syndrome.  This is an exacerbation of a chronic problem.  This is recurrence.  Plan for repeat injection today.  Continue carpal tunnel wrist brace.  Work note provided.  Recheck back as needed.  If  symptoms should recur would recommend nerve conduction study. ? ? ?PDMP not reviewed this encounter. ?Orders Placed This Encounter  ?Procedures  ? [place] LIMITED JOINT SPACE STRUCTURES UP BILAT(NO LINKED CHARGES)  ?  Order Specific  Question:   Reason for Exam (SYMPTOM  OR DIAGNOSIS REQUIRED)  ?  Answer:   bilateral wrist pain  ?  Order Specific Question:   Preferred imaging location?  ?  Answer:   [facility]  ? ?No orders of the defined types were placed in this encounter. ? ? ? ?Discussed warning signs or symptoms. Please see discharge instructions. Patient expresses understanding. ? ? ?The above documentation has been reviewed and is accurate and complete Clementeen Graham, M.D. ? ? ?

## 2021-06-11 NOTE — Patient Instructions (Addendum)
Thank you for coming in today.  ? ?You received steroid injections in both of your wrists today. Seek immediate medical attention if the joint becomes red, extremely painful, or is oozing fluid.  ? ?Recheck back as needed ?

## 2021-06-12 ENCOUNTER — Ambulatory Visit: Payer: BLUE CROSS/BLUE SHIELD | Admitting: Family Medicine

## 2021-06-13 ENCOUNTER — Encounter: Payer: Self-pay | Admitting: Family Medicine

## 2023-04-09 ENCOUNTER — Emergency Department (HOSPITAL_BASED_OUTPATIENT_CLINIC_OR_DEPARTMENT_OTHER)
Admission: EM | Admit: 2023-04-09 | Discharge: 2023-04-10 | Disposition: A | Discharge status: Home / Self Care | Payer: Medicaid Other | Attending: Emergency Medicine | Admitting: Emergency Medicine

## 2023-04-09 ENCOUNTER — Encounter (HOSPITAL_BASED_OUTPATIENT_CLINIC_OR_DEPARTMENT_OTHER): Payer: Self-pay | Admitting: Emergency Medicine

## 2023-04-09 ENCOUNTER — Emergency Department (HOSPITAL_BASED_OUTPATIENT_CLINIC_OR_DEPARTMENT_OTHER): Payer: Medicaid Other

## 2023-04-09 ENCOUNTER — Other Ambulatory Visit: Payer: Self-pay

## 2023-04-09 DIAGNOSIS — M5442 Lumbago with sciatica, left side: Secondary | ICD-10-CM | POA: Insufficient documentation

## 2023-04-09 DIAGNOSIS — M545 Low back pain, unspecified: Secondary | ICD-10-CM | POA: Diagnosis present

## 2023-04-09 LAB — URINALYSIS, W/ REFLEX TO CULTURE (INFECTION SUSPECTED)
Bilirubin Urine: NEGATIVE
Glucose, UA: NEGATIVE mg/dL
Hgb urine dipstick: NEGATIVE
Ketones, ur: NEGATIVE mg/dL
Leukocytes,Ua: NEGATIVE
Nitrite: NEGATIVE
Protein, ur: NEGATIVE mg/dL
Specific Gravity, Urine: 1.02 (ref 1.005–1.030)
pH: 5.5 (ref 5.0–8.0)

## 2023-04-09 LAB — PREGNANCY, URINE: Preg Test, Ur: NEGATIVE

## 2023-04-09 MED ORDER — PREDNISONE 20 MG PO TABS: 20.0000 mg | ORAL_TABLET | Freq: Every day | ORAL | 0 refills | Status: AC

## 2023-04-09 MED ORDER — OXYCODONE-ACETAMINOPHEN 5-325 MG PO TABS: 1.0000 | ORAL_TABLET | Freq: Four times a day (QID) | ORAL | 0 refills | Status: DC | PRN

## 2023-04-09 MED ORDER — LIDOCAINE 5 % EX PTCH: 1.0000 | MEDICATED_PATCH | CUTANEOUS | 0 refills | Status: AC

## 2023-04-09 MED ORDER — HYDROCODONE-ACETAMINOPHEN 5-325 MG PO TABS
1.0000 | ORAL_TABLET | Freq: Once | ORAL | Status: AC
  Administered 2023-04-09: 1 via ORAL
  Filled 2023-04-09: qty 1

## 2023-04-09 NOTE — Discharge Instructions (Signed)
 It was a pleasure taking care of you here in the emergency department  Your x-ray imaging did show degenerative disc changes in your back.  I have started you on a short course of some steroids as well as pain medicine to help with your symptoms.  If your symptoms do not improve please make sure to follow-up with a primary care provider  Return for new or worsening symptoms

## 2023-04-09 NOTE — ED Triage Notes (Signed)
 Pt POV steady gait- c/o lower back pain, L flank pain x 1 month. Denies urinary sx. Denies known injury to area.  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

## 2023-04-09 NOTE — ED Provider Notes (Signed)
 [place] [facility] AT [facility] Provider Note   CSN: [id number] Arrival date & time: 04/09/23  2044    History  Chief Complaint  Patient presents with   Back Pain   Flank Pain    Jill Mclean is a 43 y.o. female lower back for 1 month. Midline, tender to palpation. Does not radiate typically however now having pain to left hip, worse with walking, worse with positioning issues.  No fever, dysuria, hematuria.  No numbness, weakness, bowel or bladder incontinence, saddle paresthesia.  No pain abdomen or pelvic region.  No stones. 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 Authorizing Provider  lidocaine (LIDODERM) 5 % Place 1 patch onto the skin daily. Remove & Discard patch within 12 hours or as directed by MD 04/09/23  Yes Uriyah Massimo A, PA-C  oxyCODONE-acetaminophen (PERCOCET/ROXICET) 5-325 MG tablet Take 1 tablet by mouth every 6 (six) hours as needed for severe pain (pain score 7-10). 04/09/23  Yes Alegria Dominique A, PA-C  predniSONE (DELTASONE) 20 MG tablet Take 1 tablet (20 mg total) by mouth daily for 5 days. 04/09/23 04/14/23 Yes Ramal Eckhardt A, PA-C  phentermine (ADIPEX-P) 37.5 MG tablet Take 37.5 mg by mouth daily. 04/08/21   [provider]      Allergies    Patient has no known allergies.    Review of Systems   Review of Systems  Constitutional: Negative.   HENT: Negative.    Respiratory: Negative.    Cardiovascular: Negative.   Gastrointestinal: Negative.   Genitourinary: Negative.   Musculoskeletal:  Positive for back pain. Negative for arthralgias, gait problem, joint swelling, myalgias, neck pain and neck stiffness.  Skin: Negative.   Neurological: Negative.   All other systems reviewed and are negative.   Physical Exam Updated Vital Signs BP 136/84   Pulse 80   Temp 97.9 F (36.6 C) (Oral)   Resp  15   Ht 5\' 6"  (1.676 m)   Wt 121.6 kg   LMP 03/29/2023 (Approximate)   SpO2 100%   BMI 43.26 kg/m  Physical Exam Vitals and nursing note reviewed.  Constitutional:      General: She is not in acute distress.    Appearance: She is well-developed. She is not ill-appearing, toxic-appearing or diaphoretic.  HENT:     Head: Atraumatic.  Eyes:     Pupils: Pupils are equal, round, and reactive to light.  Cardiovascular:     Rate and Rhythm: Normal rate.     Pulses: Normal pulses.     Heart sounds: Normal heart sounds.  Pulmonary:     Effort: Pulmonary effort is normal. No respiratory distress.     Breath sounds: Normal breath sounds.  Abdominal:     General: Bowel sounds are normal. There is no distension.     Palpations: Abdomen is soft.     Tenderness: There is no abdominal tenderness. There is no right CVA tenderness, left CVA tenderness or guarding.  Musculoskeletal:        General: Tenderness present. No swelling, deformity or signs of injury. Normal range of motion.     Cervical back: Normal range of motion.       Back:     Right lower leg: No edema.     Left lower leg: No edema.     Comments: Tenderness midline lumbar region as well as left  piriformis. +SLR. Compartments soft. Full ROM  Skin:    General: Skin is warm and dry.     Capillary Refill: Capillary refill takes less than 2 seconds.     Comments: No obvious lesions or rashes on exposed skin  Neurological:     General: No focal deficit present.     Mental Status: She is alert.     Cranial Nerves: Cranial nerves 2-12 are intact.     Sensory: Sensation is intact.     Motor: Motor function is intact.     Gait: Gait is intact.  Psychiatric:        Mood and Affect: Mood normal.     ED Results / Procedures / Treatments   Labs (all labs ordered are listed, but only abnormal results are displayed) Labs Reviewed  URINALYSIS, W/ REFLEX TO CULTURE (INFECTION SUSPECTED) - Abnormal; Notable for the following  components:      Result Value   APPearance HAZY (*)    Bacteria, UA FEW (*)    All other components within normal limits  PREGNANCY, URINE    EKG None  Radiology DG Lumbar Spine Complete Result Date: 04/09/2023 CLINICAL DATA:  Chronic low back pain. EXAM: LUMBAR SPINE - COMPLETE 4+ VIEW COMPARISON:  Study of 06/03/2003. FINDINGS: Routine 5 views were all performed standing. There are 5 lumbar segments. There is no evidence of lumbar spine fracture.  Alignment is normal. Intervertebral disc spaces are maintained above L5, but with increased moderate disc space loss at L5-S1, with early reactive endplate spurring. There are mild facet joint spurs at the lowest 3 levels but no bulky arthropathy. The foramina are not well seen on the oblique views but no obvious critical foraminal stenosis is evident. The SI joints are unremarkable, as visualized. IMPRESSION: 1. No evidence of fracture or malalignment. 2. Degenerative changes at the lowest 3 levels, with increased moderate disc space loss at L5-S1. Electronically Signed   By: Almira Bar M.D.   On: 04/09/2023 23:31   Procedures Procedures    Medications Ordered in ED Medications  HYDROcodone-acetaminophen (NORCO/VICODIN) 5-325 MG per tablet 1 tablet (1 tablet Oral Given 04/09/23 2326)   ED Course/ Medical Decision Making/ A&P   42 here for evaluation of lower back pain.  Pain to midline lower back into left lateral posterior hip.  No recent falls or injuries.  Pain has been there for 1 month.  Worse with movement, position.  No urinary symptoms.  No hematuria, flank pain.  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  Pain reproducible on exam.  Will plan on labs, imaging and reassess  Labs and imaging personally viewed and interpreted:  Pregnancy test negative UA negative for infection, no blood Right lumbar region with degenerative changes  Patient reassessed.  Discussed labs and imaging with patient,  family in room.  Ambulatory here.  No red flags for back pain.  Will treat with short course of steroids, pain management lidocaine patches.  Encourage follow-up outpatient.  Symptoms not consistent with renal stone, dissection, cauda equina, discitis, osteomyelitis, transverse myelitis, psoas abscess, pyelonephritis, ectopic pregnancy, torsion, PID, obstruction, perforation  The patient has been appropriately medically screened and/or stabilized in the ED. I have low suspicion for any other emergent medical condition which would require further screening, evaluation or treatment in the ED or require inpatient management.  Patient is hemodynamically stable and in no acute distress.  Patient able to ambulate in department prior to ED.  Evaluation  does not show acute pathology that would require ongoing or additional emergent interventions while in the emergency department or further inpatient treatment.  I have discussed the diagnosis with the patient and answered all questions.  Pain is been managed while in the emergency department and patient has no further complaints prior to discharge.  Patient is comfortable with plan discussed in room and is stable for discharge at this time.  I have discussed strict return precautions for returning to the emergency department.  Patient was encouraged to follow-up with PCP/specialist refer to at discharge.                                 Medical Decision Making Amount and/or Complexity of Data Reviewed Independent Historian: friend External Data Reviewed: labs, radiology, ECG and notes. Labs: ordered. Decision-making details documented in ED Course. Radiology: ordered and independent interpretation performed. Decision-making details documented in ED Course.  Risk OTC drugs. Prescription drug management. Decision regarding hospitalization. Diagnosis or treatment significantly limited by social determinants of health.          Final Clinical  Impression(s) / ED Diagnoses Final diagnoses:  Acute midline low back pain with left-sided sciatica    Rx / DC Orders ED Discharge Orders          Ordered    predniSONE (DELTASONE) 20 MG tablet  Daily        04/09/23 2341    oxyCODONE-acetaminophen (PERCOCET/ROXICET) 5-325 MG tablet  Every 6 hours PRN        04/09/23 2341    lidocaine (LIDODERM) 5 %  Every 24 hours        04/09/23 2341              Shaunte Weissinger A, PA-C 04/09/23 2345    Pricilla Loveless, MD 04/14/23 1056

## 2023-04-09 NOTE — ED Notes (Addendum)
 RN assumed care of pt, found her alert and oriented in bed w/ a visitor bedside.  Pt requested another heating pack for her back, states they are helping, they just down last long

## 2023-06-24 ENCOUNTER — Ambulatory Visit: Admitting: Physician Assistant

## 2023-06-24 ENCOUNTER — Encounter: Payer: Self-pay | Admitting: Physician Assistant

## 2023-06-24 DIAGNOSIS — M25532 Pain in left wrist: Secondary | ICD-10-CM

## 2023-06-24 DIAGNOSIS — M25531 Pain in right wrist: Secondary | ICD-10-CM

## 2023-06-24 NOTE — Progress Notes (Signed)
   Office Visit Note   Patient: Jill Mclean           Date of Birth: 1981-02-07           MRN: [id number] Visit Date: 06/24/2023              Requested by: [facility] [address] [place],  [place] [address] PCP: [facility]   Assessment & Plan: Visit Diagnoses:  1. Bilateral wrist pain     Plan: Patient is a 43 year old woman who is a patient of Dr. Kandi Oris.  She has a history of lateral carpal tunnel syndrome which she is cared for by injections and hydrodissection.  She thought she was going to see him today and get her wrist injected again she had not had it done in a couple years.  She was also supposed to have nerve conduction studies but did so well that she did not have these done.  She has had no new injury.  She like to get back to Dr. Anson Kinnier but was mistakenly sent here.  Will have provided her his information may follow-up with us  as needed  Follow-Up Instructions: No follow-ups on file.   Orders:  No orders of the defined types were placed in this encounter.  No orders of the defined types were placed in this encounter.     Procedures: No procedures performed   Clinical Data: No additional findings.   Subjective: No chief complaint on file.   HPI pleasant 43 year old woman requesting ultrasound injections into her wrist for carpal tunnel syndrome.  She had these done 2 years ago.  She has not had any nerve conduction studies.  She did good good relief.  These were done by Dr. Alease Hunter she thought she was going to see him today  Review of Systems  All other systems reviewed and are negative.    Objective: Vital Signs: There were no vitals taken for this visit.  Physical Exam Constitutional:      Appearance: Normal appearance.  Pulmonary:     Effort: Pulmonary effort is normal.  Skin:    General: Skin is warm.  Neurological:     General: No focal deficit present.     Mental Status:  She is alert and oriented to person, place, and time.  Psychiatric:        Mood and Affect: Mood normal.        Behavior: Behavior normal.     Ortho Exam  Specialty Comments:  No specialty comments available.  Imaging: No results found.   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 No pertinent family history.  History reviewed. No pertinent surgical history. Social History   Occupational History   Not on file  Tobacco Use   Smoking status: Never   Smokeless tobacco: Never  Substance and Sexual Activity   Alcohol use: No   Drug use: No   Sexual activity: Not on file

## 2023-06-29 NOTE — Progress Notes (Deleted)
   [name], PhD, LAT, ATC acting as a scribe for Garlan Juniper, MD.  Jill Mclean is a 43 y.o. female who presents to [facility] at [facility] today for exacerbation of her bilat CTS. Pt was last seen by Dr. Alease Hunter on 06/11/21 and was given bilat carpal tunnel steroid injections. Pt advised to cont wrist braces.  Today, pt reports ***  Pertinent review of systems: ***  Relevant historical information: ***   Exam:  There were no vitals taken for this visit. General: Well Developed, well nourished, and in no acute distress.   MSK: ***    Lab and Radiology Results No results found for this or any previous visit (from the past 72 hours). No results found.     Assessment and Plan: 43 y.o. female with ***   PDMP not reviewed this encounter. No orders of the defined types were placed in this encounter.  No orders of the defined types were placed in this encounter.    Discussed warning signs or symptoms. Please see discharge instructions. Patient expresses understanding.   ***

## 2023-06-30 ENCOUNTER — Ambulatory Visit: Admitting: Family Medicine

## 2023-07-02 ENCOUNTER — Encounter: Payer: Self-pay | Admitting: Family Medicine

## 2023-07-15 NOTE — Progress Notes (Unsigned)
 Jill Muck, PhD, LAT, ATC acting as a scribe for Garlan Juniper, MD.  Jill Mclean is a 43 y.o. female who presents to [facility] at [facility] today for exacerbation of her bilat CTS. Pt was last seen by Dr. Alease Hunter on 06/11/21 and was given bilat carpal tunnel steroid injections. Pt advised to cont wrist braces.  Today, pt reports she is not able to have surgery at this time. Bilat wrist are painful w/ paresthesia into her fingers. Over the last month or so she notes decrease grip strength. She continues to wear her wrist braces.  Pertinent review of systems: No fevers or chills  Relevant historical information: Bilateral carpal tunnel syndrome.  Patient has a child with severe disability.  He is living at home and mostly wheelchair confined and uses a feeding tube.  She does get help at home and he goes to a day program. She would have trouble having surgery for her carpal tunnel due to her caregiver needs.   Exam:  BP 122/76   Pulse 95   Ht 5\' 6"  (1.676 m)   Wt 266 lb (120.7 kg)   SpO2 99%   BMI 42.93 kg/m  General: Well Developed, well nourished, and in no acute distress.   MSK: Bilaterally normal-appearing normal motion intact strength.  Positive Tinel's and Phalen's test.    Lab and Radiology Results  Carpal tunnel median nerve hydrodissection Procedure: Real-time Ultrasound Guided median nerve hydrodissection and carpal tunnel injection right Device: Philips Affiniti 50G/GE Logiq Images permanently stored and available for review in PACS Verbal informed consent obtained.  Discussed risks and benefits of procedure. Warned about infection, bleeding, hyperglycemia damage to structures among others. Patient expresses understanding and agreement Time-out conducted.   Noted no overlying erythema, induration, or other signs of local infection.   Skin prepped in a sterile fashion.   Local anesthesia: Topical Ethyl chloride.   With sterile technique and  under real time ultrasound guidance: 40 mg of Kenalog and 1 mL of lidocaine  injected into carpal tunnel around the median nerve. Fluid seen entering the carpal tunnel.   Completed without difficulty   Pain immediately resolved suggesting accurate placement of the medication.   Advised to call if fevers/chills, erythema, induration, drainage, or persistent bleeding.   Images permanently stored and available for review in the ultrasound unit.  Impression: Technically successful ultrasound guided injection.  Carpal tunnel median nerve hydrodissection Procedure: Real-time Ultrasound Guided median nerve hydrodissection and carpal tunnel injection left Device: Philips Affiniti 50G/GE Logiq Images permanently stored and available for review in PACS Verbal informed consent obtained.  Discussed risks and benefits of procedure. Warned about infection, bleeding, hyperglycemia damage to structures among others. Patient expresses understanding and agreement Time-out conducted.   Noted no overlying erythema, induration, or other signs of local infection.   Skin prepped in a sterile fashion.   Local anesthesia: Topical Ethyl chloride.   With sterile technique and under real time ultrasound guidance: 40 mg of Kenalog and 1 mL of lidocaine  injected into carpal tunnel around the median nerve. Fluid seen entering the carpal tunnel.   Completed without difficulty   Pain immediately resolved suggesting accurate placement of the medication.   Advised to call if fevers/chills, erythema, induration, drainage, or persistent bleeding.   Images permanently stored and available for review in the ultrasound unit.  Impression: Technically successful ultrasound guided injection.      Assessment and Plan: 43 y.o. female with bilateral carpal tunnel syndrome.  Previous injection  was about 2 years ago.  Plan for repeat injection bilaterally today.  Continue carpal tunnel wrist brace.  We talked about surgery.  Right now  she does not need surgery but in the future she may.  Surgery would be challenging for her due to her caregiver needs at home.   PDMP not reviewed this encounter. Orders Placed This Encounter  Procedures   [place]  LIMITED JOINT SPACE STRUCTURES UP BILAT(NO LINKED CHARGES)    Reason for Exam (SYMPTOM  OR DIAGNOSIS REQUIRED):   bilateral wrist pain    Preferred imaging location?:   [facility]   No orders of the defined types were placed in this encounter.    Discussed warning signs or symptoms. Please see discharge instructions. Patient expresses understanding.   The above documentation has been reviewed and is accurate and complete Garlan Juniper, M.D.

## 2023-07-16 ENCOUNTER — Other Ambulatory Visit: Payer: Self-pay

## 2023-07-16 ENCOUNTER — Ambulatory Visit (INDEPENDENT_AMBULATORY_CARE_PROVIDER_SITE_OTHER): Admitting: Family Medicine

## 2023-07-16 VITALS — BP 122/76 | HR 95 | Ht 66.0 in | Wt 266.0 lb

## 2023-07-16 DIAGNOSIS — Z636 Dependent relative needing care at home: Secondary | ICD-10-CM | POA: Insufficient documentation

## 2023-07-16 DIAGNOSIS — G5603 Carpal tunnel syndrome, bilateral upper limbs: Secondary | ICD-10-CM

## 2023-07-16 NOTE — Patient Instructions (Addendum)
 Thank you for coming in today.   You received an injection today. Seek immediate medical attention if the joint becomes red, extremely painful, or is oozing fluid.   Continue the carpal tunnel brace.

## 2023-12-20 ENCOUNTER — Encounter: Payer: Self-pay | Admitting: Radiology
